# Patient Record
Sex: Female | Born: 1970
Health system: Southern US, Community
[De-identification: ages and names within clinical notes are randomized; demographics above are authoritative.]

## PROBLEM LIST (undated history)

## (undated) DIAGNOSIS — K8020 Calculus of gallbladder without cholecystitis without obstruction: Secondary | ICD-10-CM

## (undated) DIAGNOSIS — N649 Disorder of breast, unspecified: Secondary | ICD-10-CM

## (undated) DIAGNOSIS — R9389 Abnormal findings on diagnostic imaging of other specified body structures: Secondary | ICD-10-CM

## (undated) DIAGNOSIS — I10 Essential (primary) hypertension: Secondary | ICD-10-CM

## (undated) DIAGNOSIS — K219 Gastro-esophageal reflux disease without esophagitis: Secondary | ICD-10-CM

## (undated) DIAGNOSIS — F419 Anxiety disorder, unspecified: Secondary | ICD-10-CM

## (undated) DIAGNOSIS — E119 Type 2 diabetes mellitus without complications: Secondary | ICD-10-CM

## (undated) DIAGNOSIS — M7989 Other specified soft tissue disorders: Secondary | ICD-10-CM

## (undated) DIAGNOSIS — R7303 Prediabetes: Secondary | ICD-10-CM

## (undated) DIAGNOSIS — R87629 Unspecified abnormal cytological findings in specimens from vagina: Secondary | ICD-10-CM

## (undated) DIAGNOSIS — E785 Hyperlipidemia, unspecified: Secondary | ICD-10-CM

## (undated) DIAGNOSIS — G4730 Sleep apnea, unspecified: Secondary | ICD-10-CM

## (undated) HISTORY — DX: Prediabetes: R73.03

## (undated) HISTORY — PX: CHOLECYSTECTOMY: SHX55

## (undated) HISTORY — PX: NASAL SINUS SURGERY: SHX719

## (undated) HISTORY — DX: Type 2 diabetes mellitus without complications: E11.9

## (undated) HISTORY — DX: Calculus of gallbladder without cholecystitis without obstruction: K80.20

## (undated) HISTORY — DX: Essential (primary) hypertension: I10

## (undated) HISTORY — DX: Unspecified abnormal cytological findings in specimens from vagina: R87.629

## (undated) HISTORY — DX: Abnormal findings on diagnostic imaging of other specified body structures: R93.89

## (undated) HISTORY — DX: Gastro-esophageal reflux disease without esophagitis: K21.9

## (undated) HISTORY — DX: Other specified soft tissue disorders: M79.89

## (undated) HISTORY — DX: Anxiety disorder, unspecified: F41.9

## (undated) HISTORY — DX: Hyperlipidemia, unspecified: E78.5

## (undated) HISTORY — DX: Sleep apnea, unspecified: G47.30

## (undated) HISTORY — DX: Disorder of breast, unspecified: N64.9

---

## 2003-04-08 ENCOUNTER — Ambulatory Visit (HOSPITAL_COMMUNITY): Admission: RE | Admit: 2003-04-08 | Discharge: 2003-04-08 | Payer: Self-pay | Admitting: Advanced Practice Midwife

## 2003-04-08 ENCOUNTER — Encounter: Payer: Self-pay | Admitting: Obstetrics and Gynecology

## 2003-05-02 ENCOUNTER — Ambulatory Visit (HOSPITAL_COMMUNITY): Admission: RE | Admit: 2003-05-02 | Discharge: 2003-05-02 | Payer: Self-pay | Admitting: General Surgery

## 2004-04-12 ENCOUNTER — Ambulatory Visit (HOSPITAL_COMMUNITY): Admission: RE | Admit: 2004-04-12 | Discharge: 2004-04-12 | Payer: Self-pay | Admitting: Otolaryngology

## 2005-06-01 ENCOUNTER — Ambulatory Visit (HOSPITAL_COMMUNITY): Admission: RE | Admit: 2005-06-01 | Discharge: 2005-06-01 | Payer: Self-pay | Admitting: Otolaryngology

## 2005-08-12 ENCOUNTER — Encounter (INDEPENDENT_AMBULATORY_CARE_PROVIDER_SITE_OTHER): Payer: Self-pay | Admitting: *Deleted

## 2005-08-12 ENCOUNTER — Ambulatory Visit (HOSPITAL_BASED_OUTPATIENT_CLINIC_OR_DEPARTMENT_OTHER): Admission: RE | Admit: 2005-08-12 | Discharge: 2005-08-12 | Payer: Self-pay | Admitting: Otolaryngology

## 2005-08-12 ENCOUNTER — Ambulatory Visit (HOSPITAL_COMMUNITY): Admission: RE | Admit: 2005-08-12 | Discharge: 2005-08-12 | Payer: Self-pay | Admitting: Otolaryngology

## 2005-12-07 ENCOUNTER — Ambulatory Visit (HOSPITAL_COMMUNITY): Admission: RE | Admit: 2005-12-07 | Discharge: 2005-12-07 | Payer: Self-pay | Admitting: Obstetrics and Gynecology

## 2007-11-29 ENCOUNTER — Other Ambulatory Visit: Admission: RE | Admit: 2007-11-29 | Discharge: 2007-11-29 | Payer: Self-pay | Admitting: Obstetrics and Gynecology

## 2007-12-05 ENCOUNTER — Ambulatory Visit (HOSPITAL_COMMUNITY): Admission: RE | Admit: 2007-12-05 | Discharge: 2007-12-05 | Payer: Self-pay | Admitting: Obstetrics & Gynecology

## 2008-12-10 ENCOUNTER — Other Ambulatory Visit: Admission: RE | Admit: 2008-12-10 | Discharge: 2008-12-10 | Payer: Self-pay | Admitting: Obstetrics and Gynecology

## 2010-01-04 ENCOUNTER — Other Ambulatory Visit: Admission: RE | Admit: 2010-01-04 | Discharge: 2010-01-04 | Payer: Self-pay | Admitting: Obstetrics and Gynecology

## 2011-01-12 ENCOUNTER — Other Ambulatory Visit (HOSPITAL_COMMUNITY)
Admission: RE | Admit: 2011-01-12 | Discharge: 2011-01-12 | Disposition: A | Discharge status: Home / Self Care | Payer: PRIVATE HEALTH INSURANCE | Source: Ambulatory Visit | Attending: Obstetrics and Gynecology | Admitting: Obstetrics and Gynecology

## 2011-01-12 ENCOUNTER — Other Ambulatory Visit: Payer: Self-pay | Admitting: Family Medicine

## 2011-01-12 DIAGNOSIS — Z01419 Encounter for gynecological examination (general) (routine) without abnormal findings: Secondary | ICD-10-CM | POA: Insufficient documentation

## 2011-03-18 NOTE — Op Note (Signed)
NAME:  Miranda Williams, Miranda Williams                ACCOUNT NO.:  [id number]   MEDICAL RECORD NO.:  [id number]          PATIENT TYPE:  AMB   LOCATION:  DSC                          FACILITY:  [facility]   PHYSICIAN:  Lucky Cowboy, MD         DATE OF BIRTH:  09/11/1971   DATE OF PROCEDURE:  08/12/2005  DATE OF DISCHARGE:                                 OPERATIVE REPORT   PREOPERATIVE DIAGNOSIS:  Right maxillary sinus polyp.   POSTOPERATIVE DIAGNOSIS:  Right maxillary sinus polyp.   PROCEDURE:  Right maxillary antrotomy with removal of right maxillary sinus  polyp.   SURGEON:  Lucky Cowboy, MD   ANESTHESIA:  General.   ESTIMATED BLOOD LOSS:  20 mL.   COMPLICATIONS:  None.   INDICATIONS:  This patient is a 40 year old female who has had a  progressively enlarging right maxillary sinus polyp which was which was  originally identified on a Panorex.  Due to the progressive enlargement and  obstruction of the natural ostia, this is removed today.   FINDINGS:  The patient was noted to have a large polyp filling the right  maxillary sinus.  This was sent to Pathology.  The bone overlying the  maxillary ostia was extremely thickened.   PROCEDURE:  The patient was taken to the operating room and placed on the  table in the supine position.  She was then placed under general  endotracheal anesthesia and the table rotated counterclockwise 90 degrees.  The face was then prepped with Betadine.  Each of the nasal cavities were  decongested with Afrin on a cottonoid pledget.  1% lidocaine with 1:100,000  of epinephrine was used to inject the right uncinate process and middle  turbinate.  After allowing time for vasoconstrictive effect, the middle  turbinate was medialized.  The uncinate process was taken down in the  midportion and inferiorly.  The microdebrider was used to take down the  residual portion of uncinate process.  Just superior to the entrance of the  maxillary sinus, the curved suction was used to  open up this area.  A small  amount of orbital fat was encountered and this was left alone and attention  turned just inferior to this site.  At this point, the very thick bone was  encountered.  This area was then opened and debrided widely, and the large  polyp debrided out of the maxillary sinus cavity using a 45-degree Storz-  Hopkins endoscope as well as a 30-degree Storz-Hopkins endoscope along with  the straight and curved microdebriders.  Surgicel was then placed around the  area debridement along with Ciprodex-soaked Gelfoam.  The nasal cavities and  the oral cavity were suctioned free of blood.  The table was rotated  clockwise 90 degrees to its original position.  The patient was awakened  from anesthesia and taken to the postanesthesia care unit in stable  condition.  There were no complications.      Lucky Cowboy, MD  Electronically Signed     SJ/MEDQ  D:  08/12/2005  T:  08/13/2005  Job:  [id number]  cc:   [facility] Th[facility]   [place], [place] Elyse Hsu MD   Doreen Beam  Fax: [phone]

## 2011-03-18 NOTE — H&P (Signed)
   NAME:  Miranda Williams, Miranda Williams                          ACCOUNT NO.:  [id number]   MEDICAL RECORD NO.:  [id number]                   PATIENT TYPE:  OUT   LOCATION:  RAD                                  FACILITY:  [facility]   PHYSICIAN:  Dalia Heading, M.D.               DATE OF BIRTH:  [birth year]   DATE OF ADMISSION:  04/08/2003  DATE OF DISCHARGE:  04/08/2003                                HISTORY & PHYSICAL   CHIEF COMPLAINT:  Biliary colic secondary to cholelithiasis.   HISTORY OF PRESENT ILLNESS:  The patient is a 40 year old white female who  was referred for evaluation and treatment of biliary colic secondary to  cholelithiasis.  She has had right upper quadrant abdominal pain with  radiation to the right flank and back and some discomfort last weekend.  No  nausea, vomiting, postprandial symptoms, fever, chills, or jaundice have  been noted.  Her symptoms have been worsening.   PAST MEDICAL HISTORY:  Unremarkable.   PAST SURGICAL HISTORY:  Unremarkable.   CURRENT MEDICATIONS:  None.   ALLERGIES:  No known drug allergies.   REVIEW OF SYSTEMS:  Noncontributory.   PHYSICAL EXAMINATION:  GENERAL:  The patient is a well developed, well  nourished white female in no acute distress.  VITAL SIGNS:  The patient is afebrile, and vital signs are stable.  LUNGS:  Clear to auscultation with equal breath sounds bilaterally.  HEENT:  No scleral icterus.  HEART:  Regular rate and rhythm without S3, S4, or murmurs.  ABDOMEN:  Soft, nontender, nondistended.  No hepatosplenomegaly, masses, or  hernias are noted.   LABORATORY DATA:  Ultrasound of the gallbladder reveals cholelithiasis with  a mildly thickened gallbladder wall and normal common bile duct.   IMPRESSION:  1. Biliary colic.  2. Cholelithiasis.    PLAN:  The patient is scheduled for laparoscopic cholecystectomy on  05/02/2003.  The risks and benefits of the procedure including bleeding,  infection, hepatobiliary injury, and the  possibility of an open procedure  were fully explained to the patient who gave full and informed consent.                                               Dalia Heading, M.D.    MAJ/MEDQ  D:  04/14/2003  T:  04/14/2003  Job:  [id number]   cc:   Dr. Barnett Abu, [facility]   Lazaro Arms, M.D.  [address], [address]  [place]  [place] [address]  Fax: [phone]

## 2011-03-18 NOTE — Op Note (Signed)
NAME:  YOSSELIN, ZOELLER                          ACCOUNT NO.:  [id number]   MEDICAL RECORD NO.:  [id number]                   PATIENT TYPE:  AMB   LOCATION:  DAY                                  FACILITY:  [facility]   PHYSICIAN:  Dalia Heading, M.D.               DATE OF BIRTH:  May 24, 1971   DATE OF PROCEDURE:  05/02/2003  DATE OF DISCHARGE:                                 OPERATIVE REPORT   PREOPERATIVE DIAGNOSES:  1. Cholecystitis.  2. Cholelithiasis.  3. Multiple skin tags on neck.   POSTOPERATIVE DIAGNOSES:  1. Cholecystitis.  2. Cholelithiasis.  3. Multiple skin tags on neck.   PROCEDURES:  1. Laparoscopic cholecystectomy.  2. Fulguration of multiple skin tags, neck.   SURGEON:  Dalia Heading, M.D.   ANESTHESIA:  General endotracheal.   INDICATIONS:  The patient is a 40 year old white female who presents with  biliary colic secondary to cholelithiasis as well as multiple skin tags on  her neck, which she would like removed.  The risks and benefits of the  procedure, including bleeding, infection, hepatobiliary injury, and the  possibility of an open procedure, were fully explained to the patient, who  gave informed consent.   PROCEDURE NOTE:  The patient was placed in the supine position.  After  induction of general endotracheal anesthesia, the abdomen was prepped and  draped using the usual sterile technique with Betadine.  Surgical site  confirmation was performed.   A supraumbilical incision was made down to the fascia.  The Veress needle  was introduced into the abdominal cavity and confirmation of placement was  done using the saline drop test. The abdomen was then insufflated to 16 mmHg  pressure.  An 11 mm trocar was introduced into the abdominal cavity under  direct visualization without difficulty.  The patient was placed in reverse  Trendelenburg position and an additional 11 mm trocar was placed in the  epigastric region and 5 mm trocars were then placed  in the right upper  quadrant and right flank regions.  The liver was inspected and noted to be  within normal limits.  The gallbladder was retracted superiorly and  laterally.  The dissection was begun around the infundibulum of the  gallbladder.  The cystic duct was first identified.  Its junction to the  infundibulum fully identified.  Endoclips were placed proximally and  distally on the cystic duct and the cystic duct was divided.  This was  likewise done to the cystic artery.  The gallbladder was then freed away  from the gallbladder fossa using Bovie electrocautery.  The gallbladder was  delivered through the epigastric trocar site using an EndoCatch bag.  The  gallbladder fossa was inspected and no abnormal bleeding or bile leakage was  noted.  Surgicel was placed in the gallbladder fossa.  All fluid and air  were then evacuated from the abdominal cavity prior to  removal of the  trocars.   All wounds were irrigated with normal saline.  All wounds were injected with  0.5% Sensorcaine.  The supraumbilical fascia as well as epigastric fascia  were reapproximated using an 0 Vicryl interrupted suture.  All skin  incisions were closed using staples.  Betadine ointment and dry sterile  dressing were applied.   Next, multiple neck skin tags were fulgurated using Bovie electrocautery.  None of the lesions were more than 2 mm in space diameter.   All tape and needle counts were correct at the end of the procedure.  The  patient was awakened and transferred to PACU in stable condition.   COMPLICATIONS:  None.   SPECIMENS:  Gallbladder with stones.   ESTIMATED BLOOD LOSS:  Minimal.                                               Dalia Heading, M.D.    MAJ/MEDQ  D:  05/02/2003  T:  05/03/2003  Job:  [id number]   cc:   Doreen Beam  [address]  [place]  [place] [address]  Fax: [phone]   Lazaro Arms, M.D.  [address], [address]  [place]  [place] [address]  Fax: [phone]

## 2012-01-17 ENCOUNTER — Other Ambulatory Visit: Payer: Self-pay | Admitting: Family Medicine

## 2012-01-17 ENCOUNTER — Other Ambulatory Visit (HOSPITAL_COMMUNITY)
Admission: RE | Admit: 2012-01-17 | Discharge: 2012-01-17 | Disposition: A | Discharge status: Home / Self Care | Payer: PRIVATE HEALTH INSURANCE | Source: Ambulatory Visit | Attending: Obstetrics and Gynecology | Admitting: Obstetrics and Gynecology

## 2012-01-17 DIAGNOSIS — Z01419 Encounter for gynecological examination (general) (routine) without abnormal findings: Secondary | ICD-10-CM | POA: Insufficient documentation

## 2012-10-31 HISTORY — PX: BREAST BIOPSY: SHX20

## 2013-02-19 ENCOUNTER — Encounter: Payer: Self-pay | Admitting: *Deleted

## 2013-02-19 DIAGNOSIS — K8020 Calculus of gallbladder without cholecystitis without obstruction: Secondary | ICD-10-CM

## 2013-02-19 DIAGNOSIS — I10 Essential (primary) hypertension: Secondary | ICD-10-CM

## 2013-02-19 DIAGNOSIS — E7849 Other hyperlipidemia: Secondary | ICD-10-CM | POA: Insufficient documentation

## 2013-02-19 DIAGNOSIS — E785 Hyperlipidemia, unspecified: Secondary | ICD-10-CM | POA: Insufficient documentation

## 2013-02-19 DIAGNOSIS — E119 Type 2 diabetes mellitus without complications: Secondary | ICD-10-CM | POA: Insufficient documentation

## 2013-02-20 ENCOUNTER — Encounter: Payer: Self-pay | Admitting: Advanced Practice Midwife

## 2013-02-20 ENCOUNTER — Other Ambulatory Visit (HOSPITAL_COMMUNITY)
Admission: RE | Admit: 2013-02-20 | Discharge: 2013-02-20 | Disposition: A | Discharge status: Home / Self Care | Payer: PRIVATE HEALTH INSURANCE | Source: Ambulatory Visit | Attending: Obstetrics and Gynecology | Admitting: Obstetrics and Gynecology

## 2013-02-20 ENCOUNTER — Ambulatory Visit (INDEPENDENT_AMBULATORY_CARE_PROVIDER_SITE_OTHER): Payer: PRIVATE HEALTH INSURANCE | Admitting: Obstetrics & Gynecology

## 2013-02-20 VITALS — BP 126/74 | Ht 62.5 in | Wt 192.0 lb

## 2013-02-20 DIAGNOSIS — Z1151 Encounter for screening for human papillomavirus (HPV): Secondary | ICD-10-CM | POA: Insufficient documentation

## 2013-02-20 DIAGNOSIS — Z01419 Encounter for gynecological examination (general) (routine) without abnormal findings: Secondary | ICD-10-CM

## 2013-02-20 DIAGNOSIS — Z0000 Encounter for general adult medical examination without abnormal findings: Secondary | ICD-10-CM

## 2013-02-20 DIAGNOSIS — Z1212 Encounter for screening for malignant neoplasm of rectum: Secondary | ICD-10-CM

## 2013-02-20 LAB — HEMOCCULT GUIAC POC 1CARD (OFFICE)

## 2013-02-20 NOTE — Progress Notes (Signed)
History of Present Illness:  Miranda Williams 42 y.o. Filed Vitals:   02/20/13 1500  BP: 126/74  Here for annual exam.  She is a few weeks shy of getting a divorce.  Pt is in a stable relationship with a man who has had a vascetomy.  Labs 09/12/13 showed normal cholesterol, A1C.  Pt has lost ~ 20 lbs and exercises.     Current Medications, Allergies, Past Medical History, Past Surgical History, Family History and Social History were reviewed in [employer] record.     Review of Systems:  Review of Systems  Constitutional: Negative for fever, chills, weight loss, malaise/fatigue and diaphoresis.  HENT: Negative for hearing loss, ear pain, nosebleeds, congestion, sore throat, neck pain, tinnitus and ear discharge.   Eyes: Negative for blurred vision, double vision, photophobia, pain, discharge and redness.  Respiratory: Negative for cough, hemoptysis, sputum production, shortness of breath, wheezing and stridor.   Cardiovascular: Negative for chest pain, palpitations, orthopnea, claudication, leg swelling and PND.  Gastrointestinal:  Negative for abdominal pain,heartburn, nausea, vomiting, diarrhea, constipation, blood in stool and melena.  Genitourinary: Negative for dysuria, urgency, frequency, hematuria and flank pain.  Musculoskeletal: Negative for myalgias, back pain, joint pain and falls.  Skin: Negative for itching and rash.  Neurological: Negative for dizziness, tingling, tremors, sensory change, speech change, focal weakness, seizures, loss of consciousness, weakness and headaches.  Endo/Heme/Allergies: Negative for environmental allergies and polydipsia. Does not bruise/bleed easily.  Psychiatric/Behavioral: Negative for depression, suicidal ideas, hallucinations, memory loss and substance abuse. The patient is not nervous/anxious and does not have insomnia.     Physical Exam: General:  Well developed, well nourished, no acute distress Skin:  Warm and  dry Neck:  Midline trachea, normal thyroid Lungs; Clear to auscultation bilaterally Breast:  No dominant palpable mass, retraction, or nipple discharge Cardiovascular: Regular rate and rhythm Abdomen:  Soft, non tender, no hepatosplenomegaly Pelvic:  External genitalia is normal in appearance.  The vagina is normal in appearance.The cervix is bulbous.  Uterus is felt to be normal size, shape, and contour.  No adnexal masses or tenderness noted. Rectal: Good sphincter tone, no polyps, or hemorrhoids felt.  Hemoccult negative. Extremities:  No swelling or varicosities noted Psych:  No mood changes   Impression:  Normal GYN exam   Plan:  F/U 1 year.  Mammogram due

## 2013-02-21 ENCOUNTER — Other Ambulatory Visit: Payer: Self-pay | Admitting: Obstetrics & Gynecology

## 2013-02-21 MED ORDER — FLUCONAZOLE 150 MG PO TABS: 150.0000 mg | ORAL_TABLET | Freq: Once | ORAL | Status: DC

## 2013-02-22 NOTE — Telephone Encounter (Signed)
Diflucan eprescribed.

## 2013-03-12 ENCOUNTER — Other Ambulatory Visit: Payer: Self-pay | Admitting: Adult Health

## 2013-03-12 DIAGNOSIS — R921 Mammographic calcification found on diagnostic imaging of breast: Secondary | ICD-10-CM

## 2013-03-13 ENCOUNTER — Other Ambulatory Visit: Payer: Self-pay | Admitting: Advanced Practice Midwife

## 2013-03-18 ENCOUNTER — Ambulatory Visit
Admission: RE | Admit: 2013-03-18 | Discharge: 2013-03-18 | Disposition: A | Discharge status: Home / Self Care | Payer: PRIVATE HEALTH INSURANCE | Source: Ambulatory Visit | Attending: Adult Health | Admitting: Adult Health

## 2013-03-18 DIAGNOSIS — R921 Mammographic calcification found on diagnostic imaging of breast: Secondary | ICD-10-CM

## 2013-03-20 ENCOUNTER — Telehealth: Payer: Self-pay | Admitting: Advanced Practice Midwife

## 2013-03-20 NOTE — Telephone Encounter (Signed)
Shrita has already talked with radiologist about breast bx and has an appt for excision this Thursday in [place].

## 2013-12-16 ENCOUNTER — Ambulatory Visit: Payer: PRIVATE HEALTH INSURANCE | Admitting: Obstetrics and Gynecology

## 2013-12-20 ENCOUNTER — Telehealth: Payer: Self-pay

## 2013-12-20 NOTE — Telephone Encounter (Signed)
Left message x 1. JSY 

## 2013-12-23 NOTE — Telephone Encounter (Signed)
Left message x 2. JSY 

## 2013-12-27 NOTE — Telephone Encounter (Signed)
Multiple messages left for pt to return the call. Left another message today. Encounter closed. Miranda Williams

## 2013-12-31 ENCOUNTER — Encounter (INDEPENDENT_AMBULATORY_CARE_PROVIDER_SITE_OTHER): Payer: Self-pay | Admitting: *Deleted

## 2014-01-01 ENCOUNTER — Encounter (INDEPENDENT_AMBULATORY_CARE_PROVIDER_SITE_OTHER): Payer: Self-pay

## 2014-01-22 ENCOUNTER — Other Ambulatory Visit: Payer: Self-pay | Admitting: Advanced Practice Midwife

## 2014-01-22 MED ORDER — SULFAMETHOXAZOLE-TMP DS 800-160 MG PO TABS: 1.0000 | ORAL_TABLET | Freq: Two times a day (BID) | ORAL | Status: DC

## 2014-01-22 NOTE — Progress Notes (Signed)
Boil on buttocks (sent picture).  Not ready to be lanced.  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  Will treat as MRSA.  Septra DS BID X 14.  If it doesn't quiet down or drain on its own soon, come in for lancing

## 2014-03-05 ENCOUNTER — Encounter: Payer: Self-pay | Admitting: Advanced Practice Midwife

## 2014-03-05 ENCOUNTER — Other Ambulatory Visit (HOSPITAL_COMMUNITY)
Admission: RE | Admit: 2014-03-05 | Discharge: 2014-03-05 | Disposition: A | Discharge status: Home / Self Care | Payer: PRIVATE HEALTH INSURANCE | Source: Ambulatory Visit | Attending: Advanced Practice Midwife | Admitting: Advanced Practice Midwife

## 2014-03-05 ENCOUNTER — Ambulatory Visit (INDEPENDENT_AMBULATORY_CARE_PROVIDER_SITE_OTHER): Payer: PRIVATE HEALTH INSURANCE | Admitting: Advanced Practice Midwife

## 2014-03-05 VITALS — BP 128/80 | Ht 62.0 in | Wt 194.0 lb

## 2014-03-05 DIAGNOSIS — Z1151 Encounter for screening for human papillomavirus (HPV): Secondary | ICD-10-CM | POA: Insufficient documentation

## 2014-03-05 DIAGNOSIS — Z01419 Encounter for gynecological examination (general) (routine) without abnormal findings: Secondary | ICD-10-CM

## 2014-03-05 DIAGNOSIS — R9389 Abnormal findings on diagnostic imaging of other specified body structures: Secondary | ICD-10-CM

## 2014-03-05 HISTORY — DX: Abnormal findings on diagnostic imaging of other specified body structures: R93.89
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
## 2014-05-13 ENCOUNTER — Encounter: Payer: Self-pay | Admitting: Adult Health

## 2014-09-01 ENCOUNTER — Encounter: Payer: Self-pay | Admitting: Advanced Practice Midwife

## 2014-12-31 ENCOUNTER — Ambulatory Visit (INDEPENDENT_AMBULATORY_CARE_PROVIDER_SITE_OTHER): Payer: PRIVATE HEALTH INSURANCE | Admitting: Advanced Practice Midwife

## 2014-12-31 ENCOUNTER — Other Ambulatory Visit: Payer: PRIVATE HEALTH INSURANCE

## 2014-12-31 ENCOUNTER — Other Ambulatory Visit: Payer: Self-pay | Admitting: Advanced Practice Midwife

## 2014-12-31 ENCOUNTER — Encounter: Payer: Self-pay | Admitting: Advanced Practice Midwife

## 2014-12-31 VITALS — BP 146/78 | Ht 62.0 in | Wt 178.0 lb

## 2014-12-31 DIAGNOSIS — Z30430 Encounter for insertion of intrauterine contraceptive device: Secondary | ICD-10-CM | POA: Diagnosis not present

## 2014-12-31 DIAGNOSIS — Z30014 Encounter for initial prescription of intrauterine contraceptive device: Secondary | ICD-10-CM

## 2014-12-31 LAB — BETA HCG QUANT (REF LAB): HCG QUANT: 1 m[IU]/mL

## 2014-12-31 MED ORDER — CLONAZEPAM 0.5 MG PO TABS: 0.5000 mg | ORAL_TABLET | ORAL | Status: DC | PRN

## 2014-12-31 NOTE — Progress Notes (Signed)
Miranda Williams is a 44 y.o. year old Caucasian female Gravida 1 Para 1  who presents for placement of a Lyletta IUD. She has not had sex in over an [place] nd her pregnancy test today is negative.    The risks and benefits of the method and placement have been thouroughly reviewed with the patient and all questions were answered.  Specifically the patient is aware of failure rate of 10/998, expulsion of the IUD and of possible perforation.  The patient is aware of irregular bleeding due to the method and understands the incidence of irregular bleeding diminishes with time. She is also aware that as of today, the Diamantina Monks is approved for 3 years, with the expectation (although not guarantee) that it will receive 5 year, and most likely 7 year, approval).  Time out was performed.  A Graves speculum was placed.  The cervix was prepped using Betadine. The uterus was found to be tilted towards the left and it sounded to 8cm.  The cervix was grasped with a tenaculum and the IUD was inserted to 8 cm.  It was pulled back 1 cm and the IUD was disengaged.  The strings were trimmed to 3 cm.  Sonogram was performed and the proper placement of the IUD was verified.   She asked for a rx for Klonopin for PRN use for mild anxiety.  Rx Klonopin 0.5mg  #30 with no RF  The patient was instructed on signs and symptoms of infection and to check for the strings after each menses or each month.  The patient is to refrain from intercourse for 3 days.  The patient is scheduled for a return appointment after her first menses or 4 weeks.  CRESENZO-DISHMAN,Permelia Bamba 12/31/2014 3:56 PM

## 2015-03-11 ENCOUNTER — Other Ambulatory Visit: Payer: PRIVATE HEALTH INSURANCE | Admitting: Advanced Practice Midwife

## 2015-03-19 ENCOUNTER — Ambulatory Visit (INDEPENDENT_AMBULATORY_CARE_PROVIDER_SITE_OTHER): Payer: PRIVATE HEALTH INSURANCE | Admitting: Advanced Practice Midwife

## 2015-03-19 ENCOUNTER — Other Ambulatory Visit (HOSPITAL_COMMUNITY)
Admission: RE | Admit: 2015-03-19 | Discharge: 2015-03-19 | Disposition: A | Discharge status: Home / Self Care | Payer: PRIVATE HEALTH INSURANCE | Source: Ambulatory Visit | Attending: Advanced Practice Midwife | Admitting: Advanced Practice Midwife

## 2015-03-19 ENCOUNTER — Encounter: Payer: Self-pay | Admitting: Advanced Practice Midwife

## 2015-03-19 VITALS — BP 130/70 | HR 76 | Ht 62.0 in | Wt 186.0 lb

## 2015-03-19 DIAGNOSIS — Z1151 Encounter for screening for human papillomavirus (HPV): Secondary | ICD-10-CM | POA: Insufficient documentation

## 2015-03-19 DIAGNOSIS — Z01419 Encounter for gynecological examination (general) (routine) without abnormal findings: Secondary | ICD-10-CM | POA: Insufficient documentation

## 2015-03-19 DIAGNOSIS — Z113 Encounter for screening for infections with a predominantly sexual mode of transmission: Secondary | ICD-10-CM | POA: Diagnosis present

## 2015-03-19 DIAGNOSIS — Z1389 Encounter for screening for other disorder: Secondary | ICD-10-CM | POA: Diagnosis not present

## 2015-03-19 DIAGNOSIS — Z131 Encounter for screening for diabetes mellitus: Secondary | ICD-10-CM

## 2015-03-19 DIAGNOSIS — B9689 Other specified bacterial agents as the cause of diseases classified elsewhere: Secondary | ICD-10-CM

## 2015-03-19 DIAGNOSIS — N760 Acute vaginitis: Secondary | ICD-10-CM

## 2015-03-19 DIAGNOSIS — R8290 Unspecified abnormal findings in urine: Secondary | ICD-10-CM

## 2015-03-19 LAB — POCT URINALYSIS DIPSTICK
GLUCOSE UA: NEGATIVE
KETONES UA: NEGATIVE
LEUKOCYTES UA: NEGATIVE
NITRITE UA: NEGATIVE
Protein, UA: NEGATIVE
RBC UA: 3

## 2015-03-19 MED ORDER — MEGESTROL ACETATE 40 MG PO TABS: 40.0000 mg | ORAL_TABLET | Freq: Every day | ORAL | Status: DC

## 2015-03-19 MED ORDER — CLONAZEPAM 0.5 MG PO TABS: 0.5000 mg | ORAL_TABLET | Freq: Two times a day (BID) | ORAL | Status: DC | PRN

## 2015-03-19 MED ORDER — METRONIDAZOLE 0.75 % VA GEL: 1.0000 | Freq: Every day | VAGINAL | Status: DC

## 2015-03-19 NOTE — Progress Notes (Signed)
Miranda Williams 44 y.o.  Filed Vitals:   03/19/15 1455  BP: 130/70  Pulse: 76     Past Medical History: Past Medical History  Diagnosis Date  . Gallstones   . Hyperlipidemia   . Hypertension   . Diabetes mellitus without complication   . 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 Cholecystectomy    . Nasal sinus surgery      removal of benign tumor    Family History: Family History  Problem Relation Age of Onset  . Cancer Mother     liver  . Cancer Father     colon cancer    Social History: History  Substance Use Topics  . Smoking status: Never Smoker   . Smokeless tobacco: Never Used  . Alcohol Use: 0.0 oz/week    0 Standard drinks or equivalent per week     Comment: socially    Allergies: No Known Allergies    Current outpatient prescriptions:  .  clonazePAM (KLONOPIN) 0.5 MG tablet, Take 1 tablet (0.5 mg total) by mouth as needed for anxiety., Disp: 30 tablet, Rfl: 0  History of Present Illness: Here for pap and physical.  Paps have been normal.  However, pt has had a new sex partner and wants to get pap today (Still has same boyfriend too). Husband still stalling on divorce.  Had IUD placed 12/2014 and has had off and on bleeding ever since, as can be expected.  Notices a fishy odor.  Declines HSV 2 testing. Had normal colonoscopy last year. Has not had to take diabetes or cholesterol meds in >2 years.  Review of Systems   Patient denies any headaches, blurred vision, shortness of breath, chest pain, abdominal pain, problems with bowel movements,  or intercourse.  Says urine smells "strong"   Physical Exam: General:  Well developed, well nourished, no acute distress Skin:  Warm and dry Neck:  Midline trachea, normal thyroid Lungs; Clear to auscultation bilaterally Breast:  No dominant palpable mass, retraction, or nipple  discharge Cardiovascular: Regular rate and rhythm Abdomen:  Soft, non tender, no hepatosplenomegaly Pelvic:  External genitalia is normal in appearance.  The vagina is normal in appearance.  The cervix is bulbous. Small amount bleeding. Slight amine odor, but no abnormal discharge.  Uterus is felt to be normal size, shape, and contour.  No adnexal masses or tenderness noted.  Extremities:  No swelling or varicosities noted Psych:  No mood changes.     Impression: BV, otherwise normal gyn exam     Plan: Metrogel qhs X5 Megace 40-80 mg qd prn bleeding Klonopin 0.5mg  prn anxiety (rarely uses) TSH, HGBA1C, CBC, CMP, lipids, HIV, RPR Mammogram in July

## 2015-03-23 LAB — CYTOLOGY - PAP

## 2015-04-15 ENCOUNTER — Other Ambulatory Visit: Payer: PRIVATE HEALTH INSURANCE | Admitting: Advanced Practice Midwife

## 2016-04-28 ENCOUNTER — Encounter: Payer: Self-pay | Admitting: Advanced Practice Midwife

## 2016-04-28 ENCOUNTER — Ambulatory Visit (INDEPENDENT_AMBULATORY_CARE_PROVIDER_SITE_OTHER): Payer: PRIVATE HEALTH INSURANCE | Admitting: Advanced Practice Midwife

## 2016-04-28 ENCOUNTER — Other Ambulatory Visit (HOSPITAL_COMMUNITY)
Admission: RE | Admit: 2016-04-28 | Discharge: 2016-04-28 | Disposition: A | Discharge status: Home / Self Care | Payer: PRIVATE HEALTH INSURANCE | Source: Ambulatory Visit | Attending: Advanced Practice Midwife | Admitting: Advanced Practice Midwife

## 2016-04-28 VITALS — BP 120/80 | HR 76 | Ht 62.0 in | Wt 187.0 lb

## 2016-04-28 DIAGNOSIS — Z01411 Encounter for gynecological examination (general) (routine) with abnormal findings: Secondary | ICD-10-CM | POA: Diagnosis present

## 2016-04-28 DIAGNOSIS — Z1151 Encounter for screening for human papillomavirus (HPV): Secondary | ICD-10-CM | POA: Insufficient documentation

## 2016-04-28 DIAGNOSIS — Z01419 Encounter for gynecological examination (general) (routine) without abnormal findings: Secondary | ICD-10-CM | POA: Diagnosis not present

## 2016-04-28 MED ORDER — CLONAZEPAM 0.5 MG PO TABS: 0.5000 mg | ORAL_TABLET | Freq: Two times a day (BID) | ORAL | Status: DC | PRN

## 2016-04-28 NOTE — Progress Notes (Signed)
Miranda Williams 45 y.o.  Filed Vitals:   04/28/16 0844  BP: 120/80  Pulse: 76     Filed Weights   04/28/16 0844  Weight: 187 lb (84.823 kg)    Past Medical History: Past Medical History  Diagnosis Date  . Gallstones   . Hyperlipidemia   . Hypertension   . Diabetes mellitus without complication ([place])   . 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 Cholecystectomy    . Nasal sinus surgery      removal of benign tumor    Family History: Family History  Problem Relation Age of Onset  . Cancer Mother     liver  . Cancer Father     colon cancer    Social History: Social History  Substance Use Topics  . Smoking status: Never Smoker   . Smokeless tobacco: Never Used  . Alcohol Use: 0.0 oz/week    0 Standard drinks or equivalent per week     Comment: socially    Allergies: No Known Allergies    Current outpatient prescriptions:  .  clonazePAM (KLONOPIN) 0.5 MG tablet, Take 1 tablet (0.5 mg total) by mouth 2 (two) times daily as needed for anxiety., Disp: 30 tablet, Rfl: 0  History of Present Illness: Here for pap and physical.  Gets them yearly due to work incentives.   Looking for another job d/t being worried about [employer] closing.  Has one partner now, not that into him anymore.  Is divorced but doesn't have a settlement yet.  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 Rarely bleeds w/IUD.   Review of Systems   Patient denies any headaches, blurred vision, shortness of breath, chest pain, abdominal pain, problems with bowel movements, urination, or intercourse.   Physical Exam: General:  Well developed, well nourished, no acute distress Skin:  Warm and dry Neck:  Midline trachea, normal thyroid Lungs; Clear to auscultation bilaterally Breast:  No dominant palpable mass, retraction, or nipple discharge Cardiovascular:  Regular rate and rhythm Abdomen:  Soft, non tender, no hepatosplenomegaly Pelvic:  External genitalia is normal in appearance.  The vagina is normal in appearance.  The cervix is bulbous.  Uterus is felt to be normal size, shape, and contour.  No adnexal masses or tenderness noted. Exam limited by habitus. IUD strings not visible Extremities:  No swelling or varicosities noted Psych:  No mood changes.     Impression: normal GYN exam     Plan: yearly physicals while working at [employer] in July Order for screening labs given

## 2016-05-02 LAB — CYTOLOGY - PAP

## 2016-05-20 ENCOUNTER — Ambulatory Visit (INDEPENDENT_AMBULATORY_CARE_PROVIDER_SITE_OTHER): Payer: PRIVATE HEALTH INSURANCE | Admitting: Obstetrics & Gynecology

## 2016-05-20 ENCOUNTER — Encounter: Payer: Self-pay | Admitting: Obstetrics & Gynecology

## 2016-05-20 VITALS — BP 152/82 | HR 68 | Ht 62.0 in | Wt 191.0 lb

## 2016-05-20 DIAGNOSIS — R87610 Atypical squamous cells of undetermined significance on cytologic smear of cervix (ASC-US): Secondary | ICD-10-CM | POA: Diagnosis not present

## 2016-05-20 DIAGNOSIS — Z3202 Encounter for pregnancy test, result negative: Secondary | ICD-10-CM | POA: Diagnosis not present

## 2016-05-20 DIAGNOSIS — R896 Abnormal cytological findings in specimens from other organs, systems and tissues: Secondary | ICD-10-CM | POA: Diagnosis not present

## 2016-05-20 DIAGNOSIS — IMO0002 Reserved for concepts with insufficient information to code with codable children: Secondary | ICD-10-CM

## 2016-05-20 LAB — POCT URINE PREGNANCY: PREG TEST UR: NEGATIVE

## 2016-05-20 NOTE — Progress Notes (Signed)
Patient ID: Miranda Williams, female   DOB: 03/11/1971, 45 y.o.   MRN: [id number] Colposcopy Procedure Note:  Colposcopy Procedure Note  Indications: Pap smear 1 months ago showed: ASCUS with NEGATIVE high risk HPV. The prior pap showed ASCUS with POSITIVE high risk HPV.  Prior cervical/vaginal disease: . Prior cervical treatment: .  Smoker:  No. New sexual partner:  Yes.    : time frame:  3 years  History of abnormal Pap: no  Procedure Details  The risks and benefits of the procedure and Written informed consent obtained.  Speculum placed in vagina and excellent visualization of cervix achieved, cervix swabbed x 3 with acetic acid solution.  Findings: Cervix: no visible lesions, no mosaicism, no punctation and no abnormal vasculature; no biopsies taken. Vaginal inspection: normal without visible lesions. Vulvar colposcopy: vulvar colposcopy not performed.  Specimens: none  Complications: none.  Plan: Pap 1 year

## 2016-06-01 ENCOUNTER — Encounter: Payer: Self-pay | Admitting: Advanced Practice Midwife

## 2016-10-26 ENCOUNTER — Other Ambulatory Visit: Payer: Self-pay | Admitting: Advanced Practice Midwife

## 2016-10-26 ENCOUNTER — Telehealth: Payer: Self-pay | Admitting: Advanced Practice Midwife

## 2016-10-26 DIAGNOSIS — N6311 Unspecified lump in the right breast, upper outer quadrant: Secondary | ICD-10-CM

## 2016-10-26 NOTE — Telephone Encounter (Signed)
Order in.

## 2016-10-26 NOTE — Telephone Encounter (Signed)
Informed pt that order is in for the U/S, pt can call and schedule.  Pt verbalized understanding.

## 2016-11-02 ENCOUNTER — Telehealth: Payer: PRIVATE HEALTH INSURANCE | Admitting: Family

## 2016-11-02 DIAGNOSIS — R05 Cough: Secondary | ICD-10-CM

## 2016-11-02 DIAGNOSIS — R059 Cough, unspecified: Secondary | ICD-10-CM

## 2016-11-02 DIAGNOSIS — J069 Acute upper respiratory infection, unspecified: Secondary | ICD-10-CM

## 2016-11-02 MED ORDER — AZITHROMYCIN 250 MG PO TABS: ORAL_TABLET | ORAL | 0 refills | Status: DC

## 2016-11-02 MED ORDER — BENZONATATE 100 MG PO CAPS: 100.0000 mg | ORAL_CAPSULE | Freq: Two times a day (BID) | ORAL | 0 refills | Status: DC | PRN

## 2016-11-02 NOTE — Progress Notes (Signed)
We are sorry that you are not feeling well.  Here is how we plan to help!  Based on what you have shared with me it looks like you have upper respiratory tract inflammation that has resulted in a significant cough.  Inflammation and infection in the upper respiratory tract is commonly called bronchitis and has four common causes:  Allergies, Viral Infections, Acid Reflux and Bacterial Infections.  Allergies, viruses and acid reflux are treated by controlling symptoms or eliminating the cause. An example might be a cough caused by taking certain blood pressure medications. You stop the cough by changing the medication. Another example might be a cough caused by acid reflux. Controlling the reflux helps control the cough.  Based on your presentation I believe you most likely have A cough due to bacteria.  When patients have a fever and a productive cough with a change in color or increased sputum production, we are concerned about bacterial bronchitis.  If left untreated it can progress to pneumonia.  If your symptoms do not improve with your treatment plan it is important that you contact your provider.   I have prescribed Azithromyin 250 mg: two tables now and then one tablet daily for 4 additonal days      In addition you may use A non-prescription cough medication called Robitussin DAC. Take 2 teaspoons every 8 hours or Delsym: take 2 teaspoons every 12 hours. and A non-prescription cough medication called Mucinex DM: take 2 tablets every 12 hours.    USE OF BRONCHODILATOR ("RESCUE") INHALERS: There is a risk from using your bronchodilator too frequently.  The risk is that over-reliance on a medication which only relaxes the muscles surrounding the breathing tubes can reduce the effectiveness of medications prescribed to reduce swelling and congestion of the tubes themselves.  Although you feel brief relief from the bronchodilator inhaler, your asthma may actually be worsening with the tubes becoming  more swollen and filled with mucus.  This can delay other crucial treatments, such as oral steroid medications. If you need to use a bronchodilator inhaler daily, several times per day, you should discuss this with your provider.  There are probably better treatments that could be used to keep your asthma under control.     HOME CARE . Only take medications as instructed by your medical team. . Complete the entire course of an antibiotic. . Drink plenty of fluids and get plenty of rest. . Avoid close contacts especially the very young and the elderly . Cover your mouth if you cough or cough into your sleeve. . Always remember to wash your hands . A steam or ultrasonic humidifier can help congestion.   GET HELP RIGHT AWAY IF: . You develop worsening fever. . You become short of breath . You cough up blood. . Your symptoms persist after you have completed your treatment plan MAKE SURE YOU   Understand these instructions.  Will watch your condition.  Will get help right away if you are not doing well or get worse.  Your e-visit answers were reviewed by a board certified advanced clinical practitioner to complete your personal care plan.  Depending on the condition, your plan could have included both over the counter or prescription medications. If there is a problem please reply  once you have received a response from your provider. Your safety is important to [place].  If you have drug allergies check your prescription carefully.    You can use MyChart to ask questions about today's visit,  request a non-urgent call back, or ask for a work or school excuse for 24 hours related to this e-Visit. If it has been greater than 24 hours you will need to follow up with your provider, or enter a new e-Visit to address those concerns. You will get an e-mail in the next two days asking about your experience.  I hope that your e-visit has been valuable and will speed your recovery. Thank you for using  e-visits.

## 2016-11-15 ENCOUNTER — Encounter (HOSPITAL_COMMUNITY): Payer: PRIVATE HEALTH INSURANCE

## 2016-11-17 ENCOUNTER — Other Ambulatory Visit (HOSPITAL_COMMUNITY): Payer: Self-pay | Admitting: Family Medicine

## 2016-11-23 ENCOUNTER — Other Ambulatory Visit: Payer: Self-pay | Admitting: Advanced Practice Midwife

## 2016-11-23 DIAGNOSIS — N6489 Other specified disorders of breast: Secondary | ICD-10-CM

## 2016-11-29 ENCOUNTER — Encounter (HOSPITAL_COMMUNITY): Payer: PRIVATE HEALTH INSURANCE

## 2016-11-29 ENCOUNTER — Ambulatory Visit (HOSPITAL_COMMUNITY)
Admission: RE | Admit: 2016-11-29 | Discharge: 2016-11-29 | Disposition: A | Discharge status: Home / Self Care | Payer: 59 | Source: Ambulatory Visit | Attending: Advanced Practice Midwife | Admitting: Advanced Practice Midwife

## 2016-11-29 DIAGNOSIS — N6489 Other specified disorders of breast: Secondary | ICD-10-CM | POA: Insufficient documentation

## 2016-11-29 DIAGNOSIS — R928 Other abnormal and inconclusive findings on diagnostic imaging of breast: Secondary | ICD-10-CM | POA: Diagnosis not present

## 2016-11-30 DIAGNOSIS — Z6835 Body mass index (BMI) 35.0-35.9, adult: Secondary | ICD-10-CM | POA: Diagnosis not present

## 2016-11-30 DIAGNOSIS — J309 Allergic rhinitis, unspecified: Secondary | ICD-10-CM | POA: Diagnosis not present

## 2016-12-15 ENCOUNTER — Telehealth: Payer: PRIVATE HEALTH INSURANCE | Admitting: Physician Assistant

## 2016-12-15 DIAGNOSIS — L255 Unspecified contact dermatitis due to plants, except food: Secondary | ICD-10-CM

## 2016-12-15 DIAGNOSIS — L237 Allergic contact dermatitis due to plants, except food: Secondary | ICD-10-CM

## 2016-12-15 MED ORDER — PREDNISONE 10 MG (21) PO TBPK: ORAL_TABLET | ORAL | 0 refills | Status: DC

## 2016-12-15 NOTE — Progress Notes (Signed)

## 2016-12-26 DIAGNOSIS — Z0000 Encounter for general adult medical examination without abnormal findings: Secondary | ICD-10-CM | POA: Diagnosis not present

## 2016-12-29 DIAGNOSIS — E782 Mixed hyperlipidemia: Secondary | ICD-10-CM | POA: Diagnosis not present

## 2016-12-29 DIAGNOSIS — Z6836 Body mass index (BMI) 36.0-36.9, adult: Secondary | ICD-10-CM | POA: Diagnosis not present

## 2016-12-29 DIAGNOSIS — Z0000 Encounter for general adult medical examination without abnormal findings: Secondary | ICD-10-CM | POA: Diagnosis not present

## 2016-12-29 DIAGNOSIS — L247 Irritant contact dermatitis due to plants, except food: Secondary | ICD-10-CM | POA: Diagnosis not present

## 2017-03-21 DIAGNOSIS — H5203 Hypermetropia, bilateral: Secondary | ICD-10-CM | POA: Diagnosis not present

## 2017-03-21 DIAGNOSIS — H52223 Regular astigmatism, bilateral: Secondary | ICD-10-CM | POA: Diagnosis not present

## 2017-03-30 DIAGNOSIS — E782 Mixed hyperlipidemia: Secondary | ICD-10-CM | POA: Diagnosis not present

## 2017-03-30 DIAGNOSIS — Z6835 Body mass index (BMI) 35.0-35.9, adult: Secondary | ICD-10-CM | POA: Diagnosis not present

## 2017-03-30 DIAGNOSIS — E6609 Other obesity due to excess calories: Secondary | ICD-10-CM | POA: Diagnosis not present

## 2017-03-30 DIAGNOSIS — Z23 Encounter for immunization: Secondary | ICD-10-CM | POA: Diagnosis not present

## 2017-04-01 DIAGNOSIS — E6609 Other obesity due to excess calories: Secondary | ICD-10-CM | POA: Diagnosis not present

## 2017-04-01 DIAGNOSIS — E782 Mixed hyperlipidemia: Secondary | ICD-10-CM | POA: Diagnosis not present

## 2017-06-02 ENCOUNTER — Telehealth: Payer: Self-pay | Admitting: Gastroenterology

## 2017-06-02 MED ORDER — DIETHYLPROPION HCL 25 MG PO TABS: ORAL_TABLET | ORAL | 0 refills | Status: DC

## 2017-06-02 NOTE — Telephone Encounter (Addendum)
PT DESIRES WEIGHT LOSS. WEIGHT: 186 LBS 5 FT 3 IN. BP 117/76 TODAY. PT SEEN AND EXAMINED. RX FOR TENUATE GIVEN.

## 2017-12-01 ENCOUNTER — Telehealth: Payer: Self-pay | Admitting: Gastroenterology

## 2017-12-01 MED ORDER — DIETHYLPROPION HCL 25 MG PO TABS: ORAL_TABLET | ORAL | 0 refills | Status: DC

## 2017-12-01 NOTE — Telephone Encounter (Signed)
PT WEIGHT DOWN TO [place].

## 2018-01-31 ENCOUNTER — Encounter: Payer: Self-pay | Admitting: Advanced Practice Midwife

## 2018-01-31 ENCOUNTER — Ambulatory Visit (INDEPENDENT_AMBULATORY_CARE_PROVIDER_SITE_OTHER): Payer: 59 | Admitting: Advanced Practice Midwife

## 2018-01-31 ENCOUNTER — Other Ambulatory Visit (HOSPITAL_COMMUNITY)
Admission: RE | Admit: 2018-01-31 | Discharge: 2018-01-31 | Disposition: A | Discharge status: Home / Self Care | Payer: 59 | Source: Ambulatory Visit | Attending: Advanced Practice Midwife | Admitting: Advanced Practice Midwife

## 2018-01-31 VITALS — BP 140/90 | HR 92 | Ht 62.2 in | Wt 189.3 lb

## 2018-01-31 DIAGNOSIS — R3989 Other symptoms and signs involving the genitourinary system: Secondary | ICD-10-CM

## 2018-01-31 DIAGNOSIS — Z01411 Encounter for gynecological examination (general) (routine) with abnormal findings: Secondary | ICD-10-CM | POA: Diagnosis not present

## 2018-01-31 DIAGNOSIS — N921 Excessive and frequent menstruation with irregular cycle: Secondary | ICD-10-CM

## 2018-01-31 DIAGNOSIS — Z1329 Encounter for screening for other suspected endocrine disorder: Secondary | ICD-10-CM

## 2018-01-31 DIAGNOSIS — Z01419 Encounter for gynecological examination (general) (routine) without abnormal findings: Secondary | ICD-10-CM | POA: Diagnosis not present

## 2018-01-31 DIAGNOSIS — Z13220 Encounter for screening for lipoid disorders: Secondary | ICD-10-CM | POA: Diagnosis not present

## 2018-01-31 LAB — POCT URINALYSIS DIPSTICK
Blood, UA: NEGATIVE
GLUCOSE UA: NEGATIVE
Ketones, UA: NEGATIVE
LEUKOCYTES UA: NEGATIVE
Nitrite, UA: NEGATIVE
Protein, UA: NEGATIVE

## 2018-01-31 NOTE — Progress Notes (Signed)
Miranda Williams 47 y.o.  Vitals:   01/31/18 1608  BP: 140/90  Pulse: 92     Filed Weights   01/31/18 1608  Weight: 189 lb 4.8 oz (85.9 kg)    Past Medical History: Past Medical History:  Diagnosis Date  . Breast disorder   . Diabetes mellitus without complication ([place])   . Dystrophic radiologic calcification 03/05/2014   Right breast; removed 02/2013; benign  . Gallstones   . Hyperlipidemia   . Hypertension     Past Surgical History: Past Surgical History:  Procedure Laterality Date  . CHOLECYSTECTOMY    . NASAL SINUS SURGERY     removal of benign tumor    Family History: Family History  Problem Relation Age of Onset  . Cancer Mother        liver  . Cancer Father        colon cancer    Social History: Social History   Tobacco Use  . Smoking status: Never Smoker  . Smokeless tobacco: Never Used  Substance Use Topics  . Alcohol use: Yes    Alcohol/week: 0.0 oz    Comment: socially  . Drug use: No    Allergies:  Allergies  Allergen Reactions  . Sulfa Antibiotics Swelling, Rash and Other (See Comments)    fever      Current Outpatient Medications:  .  levonorgestrel (MIRENA) 20 MCG/24HR IUD, 1 each by Intrauterine route once., Disp: , Rfl:  .  clonazePAM (KLONOPIN) 0.5 MG tablet, Take 1 tablet (0.5 mg total) by mouth 2 (two) times daily as needed for anxiety. (Patient not taking: Reported on 01/31/2018), Disp: 30 tablet, Rfl: 0 .  Diethylpropion HCl 25 MG TABS, 1 po 1 hour prior to meals ONCE DAILY (Patient not taking: Reported on 01/31/2018), Disp: 30 each, Rfl: 0 .  predniSONE (STERAPRED UNI-PAK 21 TAB) 10 MG (21) TBPK tablet, Take following package directions. (Patient not taking: Reported on 01/31/2018), Disp: 21 tablet, Rfl: 0  History of Present Illness: here for Pap and physical. Last pap 18 months ago, ASCUS/HPV.  Colpo neg.  Had some suprapubic pain "sharp" in nature, lasting only a second.  Happened several times, ? UTI. Still has some postcoital  bleeding.  No sx vag infection.  Has monogamous partner, declines STD testing.  Needs mammogram (watching a spot on right breast, "probably benign").     Review of Systems   Patient denies any headaches, blurred vision, shortness of breath, chest pain, abdominal pain, problems with bowel movements, urination, or intercourse.   Physical Exam: General:  Well developed, well nourished, no acute distress Skin:  Warm and dry Neck:  Midline trachea, normal thyroid Lungs; Clear to auscultation bilaterally Breast:  No dominant palpable mass, retraction, or nipple discharge Cardiovascular: Regular rate and rhythm Abdomen:  Soft, non tender, no hepatosplenomegaly Pelvic:  External genitalia is normal in appearance.  The vagina is normal in appearance.  The cervix is bulbous. IUD strings short but visible. Uterus is felt to be normal size, shape, and contour.  No adnexal masses or tenderness noted.  Extremities:  No swelling or varicosities noted Psych:  No mood changes.     Impression: Normal GYN exam BTB/spotting d/t IUD Intermittent, mild bladder pain ? neurogenic     Plan: If pap neg, repeat in 3 years If pap still + HPV, colpo  Screening labs Urine culture

## 2018-02-01 ENCOUNTER — Other Ambulatory Visit: Payer: Self-pay | Admitting: *Deleted

## 2018-02-01 ENCOUNTER — Telehealth: Payer: Self-pay | Admitting: *Deleted

## 2018-02-01 ENCOUNTER — Encounter (INDEPENDENT_AMBULATORY_CARE_PROVIDER_SITE_OTHER): Payer: Self-pay

## 2018-02-01 DIAGNOSIS — Z01419 Encounter for gynecological examination (general) (routine) without abnormal findings: Secondary | ICD-10-CM | POA: Diagnosis not present

## 2018-02-01 DIAGNOSIS — Z1329 Encounter for screening for other suspected endocrine disorder: Secondary | ICD-10-CM | POA: Diagnosis not present

## 2018-02-01 DIAGNOSIS — Z13220 Encounter for screening for lipoid disorders: Secondary | ICD-10-CM | POA: Diagnosis not present

## 2018-02-02 ENCOUNTER — Other Ambulatory Visit (HOSPITAL_COMMUNITY): Payer: Self-pay | Admitting: Advanced Practice Midwife

## 2018-02-02 ENCOUNTER — Other Ambulatory Visit: Payer: Self-pay

## 2018-02-02 ENCOUNTER — Telehealth: Payer: Self-pay | Admitting: *Deleted

## 2018-02-02 DIAGNOSIS — R928 Other abnormal and inconclusive findings on diagnostic imaging of breast: Secondary | ICD-10-CM

## 2018-02-02 DIAGNOSIS — Z09 Encounter for follow-up examination after completed treatment for conditions other than malignant neoplasm: Secondary | ICD-10-CM

## 2018-02-02 DIAGNOSIS — N6489 Other specified disorders of breast: Secondary | ICD-10-CM

## 2018-02-02 LAB — COMPREHENSIVE METABOLIC PANEL
A/G RATIO: 1.8 (ref 1.2–2.2)
ALBUMIN: 4.6 g/dL (ref 3.5–5.5)
ALT: 18 IU/L (ref 0–32)
AST: 13 IU/L (ref 0–40)
Alkaline Phosphatase: 72 IU/L (ref 39–117)
BILIRUBIN TOTAL: 0.5 mg/dL (ref 0.0–1.2)
BUN / CREAT RATIO: 14 (ref 9–23)
BUN: 10 mg/dL (ref 6–24)
CHLORIDE: 103 mmol/L (ref 96–106)
CO2: 19 mmol/L — ABNORMAL LOW (ref 20–29)
Calcium: 9.4 mg/dL (ref 8.7–10.2)
Creatinine, Ser: 0.72 mg/dL (ref 0.57–1.00)
GFR, EST AFRICAN AMERICAN: 115 mL/min/{1.73_m2} (ref >59)
GFR, EST NON AFRICAN AMERICAN: 99 mL/min/{1.73_m2} (ref >59)
GLOBULIN, TOTAL: 2.6 g/dL (ref 1.5–4.5)
Glucose: 94 mg/dL (ref 65–99)
POTASSIUM: 4.4 mmol/L (ref 3.5–5.2)
Sodium: 136 mmol/L (ref 134–144)
TOTAL PROTEIN: 7.2 g/dL (ref 6.0–8.5)

## 2018-02-02 LAB — TSH: TSH: 1.65 u[IU]/mL (ref 0.450–4.500)

## 2018-02-02 LAB — CBC
HEMATOCRIT: 39.8 % (ref 34.0–46.6)
HEMOGLOBIN: 13.7 g/dL (ref 11.1–15.9)
MCH: 29.4 pg (ref 26.6–33.0)
MCHC: 34.4 g/dL (ref 31.5–35.7)
MCV: 85 fL (ref 79–97)
Platelets: 292 10*3/uL (ref 150–379)
RBC: 4.66 x10E6/uL (ref 3.77–5.28)
RDW: 13.6 % (ref 12.3–15.4)
WBC: 5.1 10*3/uL (ref 3.4–10.8)

## 2018-02-02 LAB — LIPID PANEL
Chol/HDL Ratio: 4.8 ratio — ABNORMAL HIGH (ref 0.0–4.4)
Cholesterol, Total: 220 mg/dL — ABNORMAL HIGH (ref 100–199)
HDL: 46 mg/dL (ref >39)
LDL Calculated: 154 mg/dL — ABNORMAL HIGH (ref 0–99)
Triglycerides: 100 mg/dL (ref 0–149)
VLDL Cholesterol Cal: 20 mg/dL (ref 5–40)

## 2018-02-02 LAB — CYTOLOGY - PAP
Diagnosis: NEGATIVE
HPV: DETECTED — AB

## 2018-02-02 NOTE — Telephone Encounter (Signed)
LMOVM regarding getting mammograph ordered and scheduled.

## 2018-02-02 NOTE — Telephone Encounter (Signed)
Orders entered under wrong encounter.

## 2018-02-02 NOTE — Telephone Encounter (Signed)
Patient states she has not had a f/u since 1/18.  Informed patient she will need diagnostic mammogram so I will get that scheduled for her and call back. Patient also informed urine culture not sent but patient states she doesn't think she needs it done.

## 2018-02-07 ENCOUNTER — Encounter (INDEPENDENT_AMBULATORY_CARE_PROVIDER_SITE_OTHER): Payer: Self-pay

## 2018-02-13 ENCOUNTER — Encounter (HOSPITAL_COMMUNITY): Payer: Self-pay

## 2018-02-13 ENCOUNTER — Ambulatory Visit (HOSPITAL_COMMUNITY)
Admission: RE | Admit: 2018-02-13 | Discharge: 2018-02-13 | Disposition: A | Discharge status: Home / Self Care | Payer: 59 | Source: Ambulatory Visit | Attending: Advanced Practice Midwife | Admitting: Advanced Practice Midwife

## 2018-02-13 ENCOUNTER — Encounter (HOSPITAL_COMMUNITY): Payer: PRIVATE HEALTH INSURANCE

## 2018-02-13 DIAGNOSIS — R928 Other abnormal and inconclusive findings on diagnostic imaging of breast: Secondary | ICD-10-CM | POA: Diagnosis not present

## 2018-02-13 DIAGNOSIS — Z09 Encounter for follow-up examination after completed treatment for conditions other than malignant neoplasm: Secondary | ICD-10-CM

## 2018-02-13 DIAGNOSIS — N6489 Other specified disorders of breast: Secondary | ICD-10-CM | POA: Diagnosis not present

## 2018-02-28 ENCOUNTER — Other Ambulatory Visit: Payer: 59 | Admitting: Advanced Practice Midwife

## 2018-03-22 ENCOUNTER — Telehealth: Payer: Self-pay | Admitting: Gastroenterology

## 2018-03-22 MED ORDER — DIETHYLPROPION HCL 25 MG PO TABS
ORAL_TABLET | ORAL | 0 refills | Status: DC
Start: 2018-03-22 — End: 2018-11-19

## 2018-03-22 NOTE — Telephone Encounter (Signed)
PT WEIGHS 183 LBS, BMI 32.9. Need refill on [place]. Apr 2019: BP 128-82 HR 88.

## 2018-05-15 DIAGNOSIS — Z1331 Encounter for screening for depression: Secondary | ICD-10-CM | POA: Diagnosis not present

## 2018-05-15 DIAGNOSIS — Z1389 Encounter for screening for other disorder: Secondary | ICD-10-CM | POA: Diagnosis not present

## 2018-05-15 DIAGNOSIS — Z6833 Body mass index (BMI) 33.0-33.9, adult: Secondary | ICD-10-CM | POA: Diagnosis not present

## 2018-05-15 DIAGNOSIS — R4681 Obsessive-compulsive behavior: Secondary | ICD-10-CM | POA: Diagnosis not present

## 2018-05-15 MED FILL — SERTRALINE HCL 25 MG TABLET: 25 | 30 days supply | Qty: 30 | Fill #0

## 2018-06-06 DIAGNOSIS — Z6833 Body mass index (BMI) 33.0-33.9, adult: Secondary | ICD-10-CM | POA: Diagnosis not present

## 2018-06-06 DIAGNOSIS — R4681 Obsessive-compulsive behavior: Secondary | ICD-10-CM | POA: Diagnosis not present

## 2018-06-06 DIAGNOSIS — F419 Anxiety disorder, unspecified: Secondary | ICD-10-CM | POA: Diagnosis not present

## 2018-06-06 MED FILL — SERTRALINE HCL 50 MG TABLET: 50 | 90 days supply | Qty: 90 | Fill #0

## 2018-07-17 DIAGNOSIS — I83813 Varicose veins of bilateral lower extremities with pain: Secondary | ICD-10-CM | POA: Diagnosis not present

## 2018-07-17 DIAGNOSIS — I83893 Varicose veins of bilateral lower extremities with other complications: Secondary | ICD-10-CM | POA: Diagnosis not present

## 2018-08-14 DIAGNOSIS — I83893 Varicose veins of bilateral lower extremities with other complications: Secondary | ICD-10-CM | POA: Diagnosis not present

## 2018-08-14 DIAGNOSIS — I83813 Varicose veins of bilateral lower extremities with pain: Secondary | ICD-10-CM | POA: Diagnosis not present

## 2018-08-15 DIAGNOSIS — I83813 Varicose veins of bilateral lower extremities with pain: Secondary | ICD-10-CM | POA: Diagnosis not present

## 2018-08-15 DIAGNOSIS — I83893 Varicose veins of bilateral lower extremities with other complications: Secondary | ICD-10-CM | POA: Diagnosis not present

## 2018-09-08 DIAGNOSIS — E669 Obesity, unspecified: Secondary | ICD-10-CM | POA: Diagnosis not present

## 2018-09-08 DIAGNOSIS — R4681 Obsessive-compulsive behavior: Secondary | ICD-10-CM | POA: Diagnosis not present

## 2018-09-08 DIAGNOSIS — F419 Anxiety disorder, unspecified: Secondary | ICD-10-CM | POA: Diagnosis not present

## 2018-09-08 DIAGNOSIS — Z6835 Body mass index (BMI) 35.0-35.9, adult: Secondary | ICD-10-CM | POA: Diagnosis not present

## 2018-09-10 MED FILL — SERTRALINE HCL 100 MG TAB: 100 | 90 days supply | Qty: 90 | Fill #0

## 2018-11-09 DIAGNOSIS — I83812 Varicose veins of left lower extremities with pain: Secondary | ICD-10-CM | POA: Diagnosis not present

## 2018-11-09 DIAGNOSIS — I83811 Varicose veins of right lower extremities with pain: Secondary | ICD-10-CM | POA: Diagnosis not present

## 2018-11-09 DIAGNOSIS — I8312 Varicose veins of left lower extremity with inflammation: Secondary | ICD-10-CM | POA: Diagnosis not present

## 2018-11-09 DIAGNOSIS — I8311 Varicose veins of right lower extremity with inflammation: Secondary | ICD-10-CM | POA: Diagnosis not present

## 2018-11-12 DIAGNOSIS — I83811 Varicose veins of right lower extremities with pain: Secondary | ICD-10-CM | POA: Diagnosis not present

## 2018-11-12 DIAGNOSIS — I8311 Varicose veins of right lower extremity with inflammation: Secondary | ICD-10-CM | POA: Diagnosis not present

## 2018-11-12 DIAGNOSIS — I83812 Varicose veins of left lower extremities with pain: Secondary | ICD-10-CM | POA: Diagnosis not present

## 2018-11-12 DIAGNOSIS — I8312 Varicose veins of left lower extremity with inflammation: Secondary | ICD-10-CM | POA: Diagnosis not present

## 2018-11-14 DIAGNOSIS — I8311 Varicose veins of right lower extremity with inflammation: Secondary | ICD-10-CM | POA: Diagnosis not present

## 2018-11-14 DIAGNOSIS — I83811 Varicose veins of right lower extremities with pain: Secondary | ICD-10-CM | POA: Diagnosis not present

## 2018-11-19 ENCOUNTER — Telehealth: Payer: Self-pay | Admitting: Gastroenterology

## 2018-11-19 MED ORDER — DIETHYLPROPION HCL 25 MG PO TABS: ORAL_TABLET | ORAL | 0 refills | Status: DC

## 2018-11-19 NOTE — Telephone Encounter (Signed)
PT NEED REFILL ON TENUATE. NOW ON WEIGHT WATCHERS. SO FAR HAS LOST 9 LBS.

## 2018-11-22 ENCOUNTER — Telehealth: Payer: Self-pay | Admitting: *Deleted

## 2018-11-22 MED FILL — SERTRALINE HCL 100 MG TAB: 100 | 90 days supply | Qty: 90 | Fill #1

## 2018-11-22 NOTE — Telephone Encounter (Signed)
Pharmacy called stating patient brought in Rx for diethylpropion but on Rx it did not have any doctor information and date wrote on it was for 11/19/2017. They need updated rx given to patient to be brought back in. Sending to [facility] and Dr. Oneida Alar.

## 2018-11-23 NOTE — Telephone Encounter (Signed)
Rx written and given to pt.

## 2018-11-26 DIAGNOSIS — I8311 Varicose veins of right lower extremity with inflammation: Secondary | ICD-10-CM | POA: Diagnosis not present

## 2018-11-26 DIAGNOSIS — M7981 Nontraumatic hematoma of soft tissue: Secondary | ICD-10-CM | POA: Diagnosis not present

## 2018-11-26 DIAGNOSIS — I83891 Varicose veins of right lower extremities with other complications: Secondary | ICD-10-CM | POA: Diagnosis not present

## 2018-11-26 NOTE — Telephone Encounter (Signed)
Noted  

## 2018-12-07 DIAGNOSIS — I8311 Varicose veins of right lower extremity with inflammation: Secondary | ICD-10-CM | POA: Diagnosis not present

## 2018-12-07 DIAGNOSIS — I83891 Varicose veins of right lower extremities with other complications: Secondary | ICD-10-CM | POA: Diagnosis not present

## 2018-12-18 ENCOUNTER — Encounter (INDEPENDENT_AMBULATORY_CARE_PROVIDER_SITE_OTHER): Payer: Self-pay | Admitting: *Deleted

## 2018-12-20 DIAGNOSIS — J111 Influenza due to unidentified influenza virus with other respiratory manifestations: Secondary | ICD-10-CM | POA: Diagnosis not present

## 2018-12-20 DIAGNOSIS — Z6834 Body mass index (BMI) 34.0-34.9, adult: Secondary | ICD-10-CM | POA: Diagnosis not present

## 2018-12-20 DIAGNOSIS — J069 Acute upper respiratory infection, unspecified: Secondary | ICD-10-CM | POA: Diagnosis not present

## 2019-01-01 DIAGNOSIS — I8312 Varicose veins of left lower extremity with inflammation: Secondary | ICD-10-CM | POA: Diagnosis not present

## 2019-01-01 DIAGNOSIS — I83812 Varicose veins of left lower extremities with pain: Secondary | ICD-10-CM | POA: Diagnosis not present

## 2019-01-02 ENCOUNTER — Ambulatory Visit (HOSPITAL_COMMUNITY)
Admission: RE | Admit: 2019-01-02 | Discharge: 2019-01-02 | Disposition: A | Discharge status: Home / Self Care | Payer: 59 | Source: Ambulatory Visit | Attending: Gastroenterology | Admitting: Gastroenterology

## 2019-01-02 ENCOUNTER — Telehealth: Payer: Self-pay | Admitting: Gastroenterology

## 2019-01-02 DIAGNOSIS — R059 Cough, unspecified: Secondary | ICD-10-CM

## 2019-01-02 DIAGNOSIS — R05 Cough: Secondary | ICD-10-CM | POA: Insufficient documentation

## 2019-01-02 MED ORDER — PREDNISONE 5 MG PO TABS: ORAL_TABLET | ORAL | 0 refills | Status: DC

## 2019-01-02 NOTE — Telephone Encounter (Signed)
PT SEEN AND EXAMINED. Having cough and chest pain. LUNGS: CLEAR WITH COUGH ELICITED WITH FORCEFUL EXPIRATION. DX: REACTIVE AIRWAY DISEASE/POSTINFECTIOUS COUGH.Order CXR and add prednisone if no PNA.

## 2019-01-04 DIAGNOSIS — I8312 Varicose veins of left lower extremity with inflammation: Secondary | ICD-10-CM | POA: Diagnosis not present

## 2019-01-04 DIAGNOSIS — I83812 Varicose veins of left lower extremities with pain: Secondary | ICD-10-CM | POA: Diagnosis not present

## 2019-01-07 ENCOUNTER — Other Ambulatory Visit (INDEPENDENT_AMBULATORY_CARE_PROVIDER_SITE_OTHER): Payer: Self-pay | Admitting: *Deleted

## 2019-01-07 DIAGNOSIS — Z86010 Personal history of colonic polyps: Secondary | ICD-10-CM

## 2019-01-07 DIAGNOSIS — Z800 Family history of malignant neoplasm of digestive organs: Secondary | ICD-10-CM | POA: Insufficient documentation

## 2019-01-14 DIAGNOSIS — I83812 Varicose veins of left lower extremities with pain: Secondary | ICD-10-CM | POA: Diagnosis not present

## 2019-01-14 DIAGNOSIS — I8312 Varicose veins of left lower extremity with inflammation: Secondary | ICD-10-CM | POA: Diagnosis not present

## 2019-01-17 ENCOUNTER — Other Ambulatory Visit (HOSPITAL_COMMUNITY): Payer: Self-pay | Admitting: Advanced Practice Midwife

## 2019-01-17 DIAGNOSIS — Z1231 Encounter for screening mammogram for malignant neoplasm of breast: Secondary | ICD-10-CM

## 2019-01-23 MED FILL — SERTRALINE HCL 100 MG TAB: 100 | 90 days supply | Qty: 90 | Fill #0

## 2019-01-25 DIAGNOSIS — I83812 Varicose veins of left lower extremities with pain: Secondary | ICD-10-CM | POA: Diagnosis not present

## 2019-01-25 DIAGNOSIS — I8312 Varicose veins of left lower extremity with inflammation: Secondary | ICD-10-CM | POA: Diagnosis not present

## 2019-01-25 DIAGNOSIS — M7981 Nontraumatic hematoma of soft tissue: Secondary | ICD-10-CM | POA: Diagnosis not present

## 2019-02-25 ENCOUNTER — Ambulatory Visit (HOSPITAL_COMMUNITY): Payer: 59

## 2019-03-06 ENCOUNTER — Encounter (INDEPENDENT_AMBULATORY_CARE_PROVIDER_SITE_OTHER): Payer: Self-pay | Admitting: *Deleted

## 2019-03-06 ENCOUNTER — Telehealth (INDEPENDENT_AMBULATORY_CARE_PROVIDER_SITE_OTHER): Payer: Self-pay | Admitting: *Deleted

## 2019-03-06 NOTE — Telephone Encounter (Signed)
Patient needs suprep 

## 2019-03-07 MED ORDER — SUPREP BOWEL PREP KIT 17.5-3.13-1.6 GM/177ML PO SOLN: 1.0000 | Freq: Once | ORAL | 0 refills | Status: AC

## 2019-03-08 MED FILL — SUPREP BOWEL PREP KIT: 17.5-3.13-1 | 1 days supply | Qty: 354 | Fill #0

## 2019-03-11 ENCOUNTER — Other Ambulatory Visit: Payer: Self-pay

## 2019-03-11 ENCOUNTER — Ambulatory Visit (HOSPITAL_COMMUNITY)
Admission: RE | Admit: 2019-03-11 | Discharge: 2019-03-11 | Disposition: A | Discharge status: Home / Self Care | Payer: 59 | Source: Ambulatory Visit | Attending: Advanced Practice Midwife | Admitting: Advanced Practice Midwife

## 2019-03-11 DIAGNOSIS — Z1231 Encounter for screening mammogram for malignant neoplasm of breast: Secondary | ICD-10-CM | POA: Diagnosis not present

## 2019-03-12 DIAGNOSIS — M7981 Nontraumatic hematoma of soft tissue: Secondary | ICD-10-CM | POA: Diagnosis not present

## 2019-03-12 DIAGNOSIS — I8312 Varicose veins of left lower extremity with inflammation: Secondary | ICD-10-CM | POA: Diagnosis not present

## 2019-03-27 ENCOUNTER — Encounter (INDEPENDENT_AMBULATORY_CARE_PROVIDER_SITE_OTHER): Payer: Self-pay

## 2019-04-01 ENCOUNTER — Telehealth (INDEPENDENT_AMBULATORY_CARE_PROVIDER_SITE_OTHER): Payer: Self-pay | Admitting: *Deleted

## 2019-04-01 NOTE — Telephone Encounter (Signed)
Referring MD/PCP: Denny Levy, pa -- [facility]   Procedure: tcs  Reason/Indication:  Hx polyps, fam hx colon ca  Has patient had this procedure before?  Yes, 2010  If so, when, by whom and where?    Is there a family history of colon cancer?  Yes, parents  Who?  What age when diagnosed?    Is patient diabetic?   no      Does patient have prosthetic heart valve or mechanical valve?  no  Do you have a pacemaker?  no  Has patient ever had endocarditis? no  Has patient had joint replacement within last 12 months?  no  Is patient constipated or do they take laxatives? no  Does patient have a history of alcohol/drug use?  no  Is patient on blood thinner such as Coumadin, Plavix and/or Aspirin? no  Medications: sertraline 100 mg daily  Allergies: sulfa  Medication Adjustment per Dr Lindi Adie, NP:   Procedure date & time: 04/18/19 at 1030

## 2019-04-01 NOTE — Telephone Encounter (Signed)
agree

## 2019-04-03 DIAGNOSIS — H5203 Hypermetropia, bilateral: Secondary | ICD-10-CM | POA: Diagnosis not present

## 2019-04-04 DIAGNOSIS — I8312 Varicose veins of left lower extremity with inflammation: Secondary | ICD-10-CM | POA: Diagnosis not present

## 2019-04-04 DIAGNOSIS — I83812 Varicose veins of left lower extremities with pain: Secondary | ICD-10-CM | POA: Diagnosis not present

## 2019-04-11 ENCOUNTER — Other Ambulatory Visit (HOSPITAL_COMMUNITY): Payer: 59

## 2019-04-11 ENCOUNTER — Other Ambulatory Visit: Payer: Self-pay

## 2019-04-11 ENCOUNTER — Other Ambulatory Visit (HOSPITAL_COMMUNITY)
Admission: RE | Admit: 2019-04-11 | Discharge: 2019-04-11 | Disposition: A | Discharge status: Home / Self Care | Payer: 59 | Source: Ambulatory Visit | Attending: Internal Medicine | Admitting: Internal Medicine

## 2019-04-11 DIAGNOSIS — Z1159 Encounter for screening for other viral diseases: Secondary | ICD-10-CM | POA: Insufficient documentation

## 2019-04-12 ENCOUNTER — Other Ambulatory Visit (HOSPITAL_COMMUNITY): Payer: 59

## 2019-04-12 LAB — NOVEL CORONAVIRUS, NAA (HOSP ORDER, SEND-OUT TO REF LAB; TAT 18-24 HRS): SARS-CoV-2, NAA: NOT DETECTED

## 2019-04-15 ENCOUNTER — Ambulatory Visit (HOSPITAL_COMMUNITY)
Admission: RE | Admit: 2019-04-15 | Discharge: 2019-04-15 | Disposition: A | Discharge status: Home / Self Care | Payer: 59 | Attending: Internal Medicine | Admitting: Internal Medicine

## 2019-04-15 ENCOUNTER — Encounter (HOSPITAL_COMMUNITY)
Admission: RE | Disposition: A | Discharge status: Home / Self Care | Payer: Self-pay | Source: Home / Self Care | Attending: Internal Medicine

## 2019-04-15 ENCOUNTER — Encounter (HOSPITAL_COMMUNITY): Payer: Self-pay | Admitting: *Deleted

## 2019-04-15 ENCOUNTER — Other Ambulatory Visit: Payer: Self-pay

## 2019-04-15 DIAGNOSIS — D120 Benign neoplasm of cecum: Secondary | ICD-10-CM | POA: Diagnosis not present

## 2019-04-15 DIAGNOSIS — Z79899 Other long term (current) drug therapy: Secondary | ICD-10-CM | POA: Diagnosis not present

## 2019-04-15 DIAGNOSIS — E119 Type 2 diabetes mellitus without complications: Secondary | ICD-10-CM | POA: Diagnosis not present

## 2019-04-15 DIAGNOSIS — Z6834 Body mass index (BMI) 34.0-34.9, adult: Secondary | ICD-10-CM | POA: Diagnosis not present

## 2019-04-15 DIAGNOSIS — Z793 Long term (current) use of hormonal contraceptives: Secondary | ICD-10-CM | POA: Diagnosis not present

## 2019-04-15 DIAGNOSIS — Z860100 Personal history of colon polyps, unspecified: Secondary | ICD-10-CM | POA: Insufficient documentation

## 2019-04-15 DIAGNOSIS — K5730 Diverticulosis of large intestine without perforation or abscess without bleeding: Secondary | ICD-10-CM | POA: Diagnosis not present

## 2019-04-15 DIAGNOSIS — Z800 Family history of malignant neoplasm of digestive organs: Secondary | ICD-10-CM | POA: Insufficient documentation

## 2019-04-15 DIAGNOSIS — Z1211 Encounter for screening for malignant neoplasm of colon: Secondary | ICD-10-CM | POA: Diagnosis not present

## 2019-04-15 DIAGNOSIS — K5790 Diverticulosis of intestine, part unspecified, without perforation or abscess without bleeding: Secondary | ICD-10-CM | POA: Diagnosis not present

## 2019-04-15 DIAGNOSIS — E669 Obesity, unspecified: Secondary | ICD-10-CM | POA: Insufficient documentation

## 2019-04-15 DIAGNOSIS — Z09 Encounter for follow-up examination after completed treatment for conditions other than malignant neoplasm: Secondary | ICD-10-CM | POA: Diagnosis not present

## 2019-04-15 HISTORY — PX: POLYPECTOMY: SHX5525

## 2019-04-15 HISTORY — PX: COLONOSCOPY: SHX5424

## 2019-04-15 SURGERY — COLONOSCOPY
Anesthesia: Moderate Sedation

## 2019-04-15 MED ORDER — MEPERIDINE HCL 50 MG/ML IJ SOLN
INTRAMUSCULAR | Status: AC
  Filled 2019-04-15: qty 1

## 2019-04-15 MED ORDER — MEPERIDINE HCL 50 MG/ML IJ SOLN
INTRAMUSCULAR | Status: DC | PRN
  Administered 2019-04-15 (×3): 25 mg via INTRAVENOUS

## 2019-04-15 MED ORDER — SODIUM CHLORIDE 0.9 % IV SOLN
INTRAVENOUS | Status: DC
  Administered 2019-04-15: 07:00:00 via INTRAVENOUS

## 2019-04-15 MED ORDER — MIDAZOLAM HCL 5 MG/5ML IJ SOLN
INTRAMUSCULAR | Status: DC | PRN
  Administered 2019-04-15 (×2): 2 mg via INTRAVENOUS
  Administered 2019-04-15: 3 mg via INTRAVENOUS
  Administered 2019-04-15: 2 mg via INTRAVENOUS

## 2019-04-15 MED ORDER — MIDAZOLAM HCL 5 MG/5ML IJ SOLN
INTRAMUSCULAR | Status: AC
  Filled 2019-04-15: qty 10

## 2019-04-15 MED ORDER — STERILE WATER FOR IRRIGATION IR SOLN
Status: DC | PRN
  Administered 2019-04-15: 08:00:00 5 mL

## 2019-04-15 NOTE — Op Note (Signed)
[facility] Patient Name: Miranda Williams Procedure Date: 04/15/2019 7:20 AM MRN: [id number] Date of Birth: 1971/04/12 Attending MD: Hildred Laser , MD CSN: [id number] Age: 48 Admit Type: Outpatient Procedure:                Colonoscopy Indications:              High risk colon cancer surveillance: Personal                            history of colonic polyps, Family history of colon                            cancer in multiple first-degree relatives Providers:                Hildred Laser, MD, Otis Peak B. Sharon Seller, RN, Nelma Rothman, Technician Referring MD:             Denny Levy, PA Medicines:                Meperidine 75 mg IV, Midazolam 9 mg IV Complications:            No immediate complications. Estimated Blood Loss:     Estimated blood loss was minimal. Procedure:                Pre-Anesthesia Assessment:                           - Prior to the procedure, a History and Physical                            was performed, and patient medications and                            allergies were reviewed. The patient's tolerance of                            previous anesthesia was also reviewed. The risks                            and benefits of the procedure and the sedation                            options and risks were discussed with the patient.                            All questions were answered, and informed consent                            was obtained. Prior Anticoagulants: The patient has                            taken no previous anticoagulant or antiplatelet  agents. ASA Grade Assessment: II - A patient with                            mild systemic disease. After reviewing the risks                            and benefits, the patient was deemed in                            satisfactory condition to undergo the procedure.                           After obtaining informed consent, the colonoscope                      was passed under direct vision. Throughout the                            procedure, the patient's blood pressure, pulse, and                            oxygen saturations were monitored continuously. The                            [id number]-H190DL ([id number]) scope was introduced through                            the anus and advanced to the the cecum, identified                            by appendiceal orifice and ileocecal valve. The                            colonoscopy was performed without difficulty. The                            patient tolerated the procedure well. The quality                            of the bowel preparation was good. The ileocecal                            valve, appendiceal orifice, and rectum were                            photographed. Scope In: 7:41:38 AM Scope Out: 8:05:03 AM Scope Withdrawal Time: 0 hours 13 minutes 9 seconds  Total Procedure Duration: 0 hours 23 minutes 25 seconds  Findings:      The perianal and digital rectal examinations were normal.      Two sessile polyps were found in the cecum. The polyps were small in       size. These were biopsied with a cold forceps for histology. The       pathology specimen was placed into Bottle Number 1.      A  few diverticula were found in the sigmoid colon.      The retroflexed view of the distal rectum and anal verge was normal and       showed no anal or rectal abnormalities. Impression:               - Two small polyps in the cecum. Biopsied.                           - Diverticulosis in the sigmoid colon. Moderate Sedation:      Moderate (conscious) sedation was administered by the endoscopy nurse       and supervised by the endoscopist. The following parameters were       monitored: oxygen saturation, heart rate, blood pressure, CO2       capnography and response to care. Total physician intraservice time was       31 minutes. Recommendation:           - Patient has a contact  number available for                            emergencies. The signs and symptoms of potential                            delayed complications were discussed with the                            patient. Return to normal activities tomorrow.                            Written discharge instructions were provided to the                            patient.                           - High fiber diet today.                           - Continue present medications.                           - No aspirin, ibuprofen, naproxen, or other                            non-steroidal anti-inflammatory drugs for 1 day.                           - Await pathology results.                           - Repeat colonoscopy in 5 years for surveillance. Procedure Code(s):        --- Professional ---                           [phone], Colonoscopy, flexible; with biopsy, single  or multiple                           99153, Moderate sedation; each additional 15                            minutes intraservice time                           G0500, Moderate sedation services provided by the                            same physician or other qualified health care                            professional performing a gastrointestinal                            endoscopic service that sedation supports,                            requiring the presence of an independent trained                            observer to assist in the monitoring of the                            patient's level of consciousness and physiological                            status; initial 15 minutes of intra-service time;                            patient age 54 years or older (additional time may                            be reported with [phone], as appropriate) Diagnosis Code(s):        --- Professional ---                           Z86.010, Personal history of colonic polyps                           K63.5, Polyp  of colon                           Z80.0, Family history of malignant neoplasm of                            digestive organs                           K57.30, Diverticulosis of large intestine without                            perforation or abscess without bleeding  CPT copyright 2019 [employer] Association. All rights reserved. The codes documented in this report are preliminary and upon coder review may  be revised to meet current compliance requirements. Hildred Laser, MD Hildred Laser, MD 04/15/2019 8:17:14 AM This report has been signed electronically. Number of Addenda: 0

## 2019-04-15 NOTE — H&P (Addendum)
Miranda Williams is an 48 y.o. female.   Chief Complaint: Patient is here for colonoscopy. HPI: Patient is 48 year old Caucasian female who is here for surveillance colonoscopy.  She denies abdominal pain change in bowel habits or rectal bleeding.  Her first colonoscopy was 10 years ago and second colonoscopy was 5 years ago. Raquel Sarna history significant for CRC in both parents.  Her father was diagnosed with rectal adenocarcinoma at age 63 and died at 66 of cardiac disease.  Mother was diagnosed with stage IV CRC at age 50 and lived to be 5 years.  Past Medical History:  Diagnosis Date  . Breast disorder   . Diabetes mellitus without complication [facility]): Blood glucose levels have been normal since weight loss   . Dystrophic radiologic calcification 03/05/2014   Right breast; removed 02/2013; benign  . Gallstones   . Hyperlipidemia   . Obesity     Past Surgical History:  Procedure Laterality Date  . CHOLECYSTECTOMY    . NASAL SINUS SURGERY     removal of benign tumor    Family History  Problem Relation Age of Onset  . Cancer Mother        liver  . Cancer Father        colon cancer   Social History:  reports that she has never smoked. She has never used smokeless tobacco. She reports current alcohol use. She reports that she does not use drugs.  Allergies:  Allergies  Allergen Reactions  . Sulfa Antibiotics Swelling, Rash and Other (See Comments)    fever    Medications Prior to Admission  Medication Sig Dispense Refill  . levonorgestrel (MIRENA) 20 MCG/24HR IUD 1 each by Intrauterine route once.    . sertraline (ZOLOFT) 100 MG tablet Take 100 mg by mouth at bedtime.      No results found for this or any previous visit (from the past 48 hour(s)). No results found.  ROS  Blood pressure 126/76, pulse 71, temperature 98 F (36.7 C), temperature source Oral, resp. rate 18, height 5' 2.5" (1.588 m), weight 85.7 kg, SpO2 100 %. Physical Exam  Constitutional: She appears  well-developed and well-nourished.  HENT:  Mouth/Throat: Oropharynx is clear and moist.  Eyes: Conjunctivae are normal.  Neck: No thyromegaly present.  Cardiovascular: Normal rate, regular rhythm and normal heart sounds.  No murmur heard. Respiratory: Effort normal and breath sounds normal.  GI:  Abdomen is full.  Soft and nontender with organomegaly or masses.  Musculoskeletal:        General: No edema.  Lymphadenopathy:    She has no cervical adenopathy.  Neurological: She is alert.  Skin: Skin is warm and dry.     Assessment/Plan History of colonic adenoma. Family history of CRC in both parents. Surveillance colonoscopy.  Hildred Laser, MD 04/15/2019, 7:24 AM

## 2019-04-15 NOTE — Discharge Instructions (Signed)
Colon Polyps  Polyps are tissue growths inside the body. Polyps can grow in many places, including the large intestine (colon). A polyp may be a round bump or a mushroom-shaped growth. You could have one polyp or several. Most colon polyps are noncancerous (benign). However, some colon polyps can become cancerous over time. Finding and removing the polyps early can help prevent this. What are the causes? The exact cause of colon polyps is not known. What increases the risk? You are more likely to develop this condition if you:  Have a family history of colon cancer or colon polyps.  Are older than 81 or older than 45 if you are African American.  Have inflammatory bowel disease, such as ulcerative colitis or Crohn's disease.  Have certain hereditary conditions, such as: ? Familial adenomatous polyposis. ? Lynch syndrome. ? Turcot syndrome. ? Peutz-Jeghers syndrome.  Are overweight.  Smoke cigarettes.  Do not get enough exercise.  Drink too much alcohol.  Eat a diet that is high in fat and red meat and low in fiber.  Had childhood cancer that was treated with abdominal radiation. What are the signs or symptoms? Most polyps do not cause symptoms. If you have symptoms, they may include:  Blood coming from your rectum when having a bowel movement.  Blood in your stool. The stool may look dark red or black.  Abdominal pain.  A change in bowel habits, such as constipation or diarrhea. How is this diagnosed? This condition is diagnosed with a colonoscopy. This is a procedure in which a lighted, flexible scope is inserted into the anus and then passed into the colon to examine the area. Polyps are sometimes found when a colonoscopy is done as part of routine cancer screening tests. How is this treated? Treatment for this condition involves removing any polyps that are found. Most polyps can be removed during a colonoscopy. Those polyps will then be tested for cancer.  Additional treatment may be needed depending on the results of testing. Follow these instructions at home: Lifestyle  Maintain a healthy weight, or lose weight if recommended by your health care provider.  Exercise every day or as told by your health care provider.  Do not use any products that contain nicotine or tobacco, such as cigarettes and e-cigarettes. If you need help quitting, ask your health care provider.  If you drink alcohol, limit how much you have: ? 0-1 drink a day for women. ? 0-2 drinks a day for men.  Be aware of how much alcohol is in your drink. In the [place] one drink equals one 12 oz bottle of beer (355 mL), one 5 oz glass of wine (148 mL), or one 1 oz shot of hard liquor (44 mL). Eating and drinking   Eat foods that are high in fiber, such as fruits, vegetables, and whole grains.  Eat foods that are high in calcium and vitamin D, such as milk, cheese, yogurt, eggs, liver, fish, and broccoli.  Limit foods that are high in fat, such as fried foods and desserts.  Limit the amount of red meat and processed meat you eat, such as hot dogs, sausage, bacon, and lunch meats. General instructions  Keep all follow-up visits as told by your health care provider. This is important. ? This includes having regularly scheduled colonoscopies. ? Talk to your health care provider about when you need a colonoscopy. Contact a health care provider if:  You have new or worsening bleeding during a bowel movement.  You have new or increased blood in your stool.  You have a change in bowel habits.  You lose weight for no known reason. Summary  Polyps are tissue growths inside the body. Polyps can grow in many places, including the colon.  Most colon polyps are noncancerous (benign), but some can become cancerous over time.  This condition is diagnosed with a colonoscopy.  Treatment for this condition involves removing any polyps that are found. Most polyps can be removed  during a colonoscopy. This information is not intended to replace advice given to you by your health care provider. Make sure you discuss any questions you have with your health care provider. Document Released: 07/13/2004 Document Revised: 02/01/2018 Document Reviewed: 02/01/2018 Elsevier Interactive Patient Education  2019 [place].  Colonoscopy, Adult, Care After This sheet gives you information about how to care for yourself after your procedure. Your health care provider may also give you more specific instructions. If you have problems or questions, contact your health care provider. What can I expect after the procedure? After the procedure, it is common to have:  A small amount of blood in your stool for 24 hours after the procedure.  Some gas.  Mild abdominal cramping or bloating. Follow these instructions at home: General instructions  For the first 24 hours after the procedure: ? Do not drive or use machinery. ? Do not sign important documents. ? Do not drink alcohol. ? Do your regular daily activities at a slower pace than normal. ? Eat soft, easy-to-digest foods.  Take over-the-counter or prescription medicines only as told by your health care provider. Relieving cramping and bloating   Try walking around when you have cramps or feel bloated.  Apply heat to your abdomen as told by your health care provider. Use a heat source that your health care provider recommends, such as a moist heat pack or a heating pad. ? Place a towel between your skin and the heat source. ? Leave the heat on for 20-30 minutes. ? Remove the heat if your skin turns bright red. This is especially important if you are unable to feel pain, heat, or cold. You may have a greater risk of getting burned. Eating and drinking   Drink enough fluid to keep your urine pale yellow.  Resume your normal diet as instructed by your health care provider. Avoid heavy or fried foods that are hard to  digest.  Avoid drinking alcohol for as long as instructed by your health care provider. Contact a health care provider if:  You have blood in your stool 2-3 days after the procedure. Get help right away if:  You have more than a small spotting of blood in your stool.  You pass large blood clots in your stool.  Your abdomen is swollen.  You have nausea or vomiting.  You have a fever.  You have increasing abdominal pain that is not relieved with medicine. Summary  After the procedure, it is common to have a small amount of blood in your stool. You may also have mild abdominal cramping and bloating.  For the first 24 hours after the procedure, do not drive or use machinery, sign important documents, or drink alcohol.  Contact your health care provider if you have a lot of blood in your stool, nausea or vomiting, a fever, or increased abdominal pain. This information is not intended to replace advice given to you by your health care provider. Make sure you discuss any questions you have with  your health care provider. Document Released: 05/31/2004 Document Revised: 08/09/2017 Document Reviewed: 12/29/2015 Elsevier Interactive Patient Education  2019 [place]. No aspirin or NSAIDs for 24 hours. Resume usual medications as before. High-fiber diet. No driving for 24 hours. Physician will call with biopsy results.

## 2019-04-18 DIAGNOSIS — Z800 Family history of malignant neoplasm of digestive organs: Secondary | ICD-10-CM

## 2019-04-18 DIAGNOSIS — Z86010 Personal history of colonic polyps: Principal | ICD-10-CM

## 2019-04-19 ENCOUNTER — Encounter (HOSPITAL_COMMUNITY): Payer: Self-pay | Admitting: Internal Medicine

## 2019-04-29 DIAGNOSIS — I8311 Varicose veins of right lower extremity with inflammation: Secondary | ICD-10-CM | POA: Diagnosis not present

## 2019-04-29 DIAGNOSIS — I83811 Varicose veins of right lower extremities with pain: Secondary | ICD-10-CM | POA: Diagnosis not present

## 2019-05-28 ENCOUNTER — Other Ambulatory Visit (HOSPITAL_COMMUNITY)
Admission: RE | Admit: 2019-05-28 | Discharge: 2019-05-28 | Disposition: A | Discharge status: Home / Self Care | Payer: 59 | Source: Ambulatory Visit | Attending: Adult Health | Admitting: Adult Health

## 2019-05-28 ENCOUNTER — Other Ambulatory Visit: Payer: Self-pay

## 2019-05-28 ENCOUNTER — Ambulatory Visit (INDEPENDENT_AMBULATORY_CARE_PROVIDER_SITE_OTHER): Payer: 59 | Admitting: Adult Health

## 2019-05-28 ENCOUNTER — Encounter: Payer: Self-pay | Admitting: Adult Health

## 2019-05-28 VITALS — BP 125/78 | HR 60 | Ht 62.25 in | Wt 190.5 lb

## 2019-05-28 DIAGNOSIS — Z131 Encounter for screening for diabetes mellitus: Secondary | ICD-10-CM

## 2019-05-28 DIAGNOSIS — Z113 Encounter for screening for infections with a predominantly sexual mode of transmission: Secondary | ICD-10-CM

## 2019-05-28 DIAGNOSIS — Z01419 Encounter for gynecological examination (general) (routine) without abnormal findings: Secondary | ICD-10-CM | POA: Insufficient documentation

## 2019-05-28 DIAGNOSIS — Z975 Presence of (intrauterine) contraceptive device: Secondary | ICD-10-CM | POA: Insufficient documentation

## 2019-05-28 DIAGNOSIS — Z1321 Encounter for screening for nutritional disorder: Secondary | ICD-10-CM

## 2019-05-28 NOTE — Addendum Note (Signed)
Addended by: Linton Rump on: 05/28/2019 04:28 PM   Modules accepted: Orders

## 2019-05-28 NOTE — Progress Notes (Signed)
Patient ID: Miranda Williams, female   DOB: 1971/04/30, 48 y.o.   MRN: [id number] History of Present Illness: Miranda Williams is a 48 year old white female, divorced, G1P1 in for a well woman gyn exam and pap, her pap was +HPV 01/31/18. She had colonoscopy in June had 2 polyps removed by Dr Laural Golden, and gets one every 5 years. She has a Liletta IUD that was inserted 12/31/14.  And had mammogram 03/11/19, that was negative.  PCP is Miranda Williams.   Current Medications, Allergies, Past Medical History, Past Surgical History, Family History and Social History were reviewed in [employer] record.     Review of Systems: Patient denies any headaches, hearing loss, fatigue, blurred vision, shortness of breath, chest pain, abdominal pain, problems with bowel movements, urination, or intercourse. No joint pain or mood swings. She says weight is up and now.     Physical Exam:BP 125/78 (BP Location: Left Arm, Patient Position: Sitting, Cuff Size: Normal)   Pulse 60   Ht 5' 2.25" (1.581 m)   Wt 190 lb 8 oz (86.4 kg)   BMI 34.56 kg/m  General:  Well developed, well nourished, no acute distress Skin:  Warm and dry Neck:  Midline trachea, normal thyroid, good ROM, no lymphadenopathy Lungs; Clear to auscultation bilaterally Breast:  No dominant palpable mass, retraction, or nipple discharge Cardiovascular: Regular rate and rhythm Abdomen:  Soft, non tender, no hepatosplenomegaly Pelvic:  External genitalia is normal in appearance, no lesions.  The vagina is normal in appearance. Urethra has no lesions or masses. The cervix is bulbous, and smooth, +IUD strings at os, pap with HPV with 16/18 genotyping reflex.Marland Kitchen  Uterus is felt to be normal size, shape, and contour.  No adnexal masses or tenderness noted.Bladder is non tender, no masses felt. Rectal: deferred. Extremities/musculoskeletal:  No swelling or varicosities noted, no clubbing or cyanosis Psych:  No mood changes, alert and  cooperative,seems happy Fall risk is low PHQ 2 score 0.  Examination chaperoned by Miranda Pupa LPN.  She requests fasting labs.   Impression: 1. Encounter for gynecological examination with Papanicolaou smear of cervix   2. IUD (intrauterine device) in place   3. Screening for diabetes mellitus   4. Encounter for vitamin deficiency screening   5. Screening examination for STD (sexually transmitted disease)       Plan: Check CBC,CMP,TSH and lipids,A1c, vitamin D and HIV Physical in 1 year Pap in 3 if normal Mammogram yearly Colonoscopy 2025

## 2019-05-30 ENCOUNTER — Other Ambulatory Visit (HOSPITAL_COMMUNITY)
Admission: RE | Admit: 2019-05-30 | Discharge: 2019-05-30 | Disposition: A | Discharge status: Home / Self Care | Payer: 59 | Source: Other Acute Inpatient Hospital | Attending: Adult Health | Admitting: Adult Health

## 2019-05-30 DIAGNOSIS — Z01419 Encounter for gynecological examination (general) (routine) without abnormal findings: Secondary | ICD-10-CM | POA: Diagnosis not present

## 2019-05-30 DIAGNOSIS — Z113 Encounter for screening for infections with a predominantly sexual mode of transmission: Secondary | ICD-10-CM | POA: Insufficient documentation

## 2019-05-30 DIAGNOSIS — Z1321 Encounter for screening for nutritional disorder: Secondary | ICD-10-CM | POA: Insufficient documentation

## 2019-05-30 LAB — COMPREHENSIVE METABOLIC PANEL
ALT: 21 U/L (ref 0–44)
AST: 17 U/L (ref 15–41)
Albumin: 4.5 g/dL (ref 3.5–5.0)
Alkaline Phosphatase: 71 U/L (ref 38–126)
Anion gap: 8 (ref 5–15)
BUN: 11 mg/dL (ref 6–20)
CO2: 25 mmol/L (ref 22–32)
Calcium: 9.2 mg/dL (ref 8.9–10.3)
Chloride: 105 mmol/L (ref 98–111)
Creatinine, Ser: 0.74 mg/dL (ref 0.44–1.00)
GFR calc Af Amer: >60 mL/min (ref >60)
GFR calc non Af Amer: >60 mL/min (ref >60)
Glucose, Bld: 101 mg/dL — ABNORMAL HIGH (ref 70–99)
Potassium: 4.1 mmol/L (ref 3.5–5.1)
Sodium: 138 mmol/L (ref 135–145)
Total Bilirubin: 0.9 mg/dL (ref 0.3–1.2)
Total Protein: 7.8 g/dL (ref 6.5–8.1)

## 2019-05-30 LAB — TSH: TSH: 1.765 u[IU]/mL (ref 0.350–4.500)

## 2019-05-30 LAB — CBC
HCT: 43 % (ref 36.0–46.0)
Hemoglobin: 13.8 g/dL (ref 12.0–15.0)
MCH: 28.6 pg (ref 26.0–34.0)
MCHC: 32.1 g/dL (ref 30.0–36.0)
MCV: 89 fL (ref 80.0–100.0)
Platelets: 296 10*3/uL (ref 150–400)
RBC: 4.83 MIL/uL (ref 3.87–5.11)
RDW: 13.1 % (ref 11.5–15.5)
WBC: 5.9 10*3/uL (ref 4.0–10.5)
nRBC: 0 % (ref 0.0–0.2)

## 2019-05-30 LAB — LIPID PANEL
Cholesterol: 279 mg/dL — ABNORMAL HIGH (ref 0–200)
HDL: 49 mg/dL (ref >40)
LDL Cholesterol: 216 mg/dL — ABNORMAL HIGH (ref 0–99)
Total CHOL/HDL Ratio: 5.7 RATIO
Triglycerides: 71 mg/dL (ref <150)
VLDL: 14 mg/dL (ref 0–40)

## 2019-05-30 LAB — HEMOGLOBIN A1C
Hgb A1c MFr Bld: 5.9 % — ABNORMAL HIGH (ref 4.8–5.6)
Mean Plasma Glucose: 122.63 mg/dL

## 2019-05-31 LAB — HIV ANTIBODY (ROUTINE TESTING W REFLEX): HIV Screen 4th Generation wRfx: NONREACTIVE

## 2019-06-01 LAB — CYTOLOGY - PAP
Adequacy: ABSENT
Diagnosis: NEGATIVE
HPV 16/18/45 genotyping: NEGATIVE
HPV: DETECTED — AB

## 2019-06-01 LAB — VITAMIN D 25 HYDROXY (VIT D DEFICIENCY, FRACTURES): Vit D, 25-Hydroxy: 27.1 ng/mL — ABNORMAL LOW (ref 30.0–100.0)

## 2019-06-03 ENCOUNTER — Telehealth: Payer: Self-pay | Admitting: Adult Health

## 2019-06-03 NOTE — Telephone Encounter (Signed)
Left message that pap negative for malignancy but +HPV, needs colposcopy (per ASCCP 2019 guidelines)

## 2019-06-05 DIAGNOSIS — R739 Hyperglycemia, unspecified: Secondary | ICD-10-CM | POA: Diagnosis not present

## 2019-06-05 DIAGNOSIS — E782 Mixed hyperlipidemia: Secondary | ICD-10-CM | POA: Diagnosis not present

## 2019-06-05 DIAGNOSIS — R4681 Obsessive-compulsive behavior: Secondary | ICD-10-CM | POA: Diagnosis not present

## 2019-06-05 DIAGNOSIS — Z6834 Body mass index (BMI) 34.0-34.9, adult: Secondary | ICD-10-CM | POA: Diagnosis not present

## 2019-06-05 MED FILL — SERTRALINE HCL 100 MG TAB: 100 | 90 days supply | Qty: 135 | Fill #0

## 2019-06-05 MED FILL — ROSUVASTATIN CALCIUM 5 MG T: 5 | 30 days supply | Qty: 30 | Fill #0

## 2019-06-11 DIAGNOSIS — M79672 Pain in left foot: Secondary | ICD-10-CM | POA: Diagnosis not present

## 2019-06-11 DIAGNOSIS — M79671 Pain in right foot: Secondary | ICD-10-CM | POA: Diagnosis not present

## 2019-06-11 DIAGNOSIS — M7742 Metatarsalgia, left foot: Secondary | ICD-10-CM | POA: Diagnosis not present

## 2019-06-11 DIAGNOSIS — M7741 Metatarsalgia, right foot: Secondary | ICD-10-CM | POA: Diagnosis not present

## 2019-06-11 DIAGNOSIS — M25872 Other specified joint disorders, left ankle and foot: Secondary | ICD-10-CM | POA: Diagnosis not present

## 2019-06-11 DIAGNOSIS — M25871 Other specified joint disorders, right ankle and foot: Secondary | ICD-10-CM | POA: Diagnosis not present

## 2019-06-11 MED FILL — MELOXICAM 15 MG TABLET: 15 | 30 days supply | Qty: 30 | Fill #0

## 2019-06-18 ENCOUNTER — Encounter: Payer: 59 | Admitting: Obstetrics & Gynecology

## 2019-07-01 ENCOUNTER — Encounter: Payer: 59 | Admitting: Obstetrics & Gynecology

## 2019-07-02 MED FILL — ROSUVASTATIN CALCIUM 5 MG T: 5 | 30 days supply | Qty: 30 | Fill #1

## 2019-08-01 DIAGNOSIS — F411 Generalized anxiety disorder: Secondary | ICD-10-CM | POA: Diagnosis not present

## 2019-08-01 DIAGNOSIS — Z6834 Body mass index (BMI) 34.0-34.9, adult: Secondary | ICD-10-CM | POA: Diagnosis not present

## 2019-08-01 DIAGNOSIS — R4681 Obsessive-compulsive behavior: Secondary | ICD-10-CM | POA: Diagnosis not present

## 2019-08-01 MED FILL — ESCITALOPRAM 10 MG TABLET: 10 | 30 days supply | Qty: 30 | Fill #0

## 2019-08-01 MED FILL — clonazePAM 0.5 MG TABS: 0.5 | 10 days supply | Qty: 10 | Fill #0

## 2019-08-01 MED FILL — ROSUVASTATIN CALCIUM 5 MG T: 5 | 30 days supply | Qty: 30 | Fill #2

## 2019-08-09 DIAGNOSIS — I83893 Varicose veins of bilateral lower extremities with other complications: Secondary | ICD-10-CM | POA: Diagnosis not present

## 2019-08-09 DIAGNOSIS — I83813 Varicose veins of bilateral lower extremities with pain: Secondary | ICD-10-CM | POA: Diagnosis not present

## 2019-09-02 MED FILL — ESCITALOPRAM 10 MG TABLET: 10 | 30 days supply | Qty: 30 | Fill #0

## 2019-09-02 MED FILL — ROSUVASTATIN CALCIUM 5 MG T: 5 | 30 days supply | Qty: 30 | Fill #0

## 2019-09-07 DIAGNOSIS — E782 Mixed hyperlipidemia: Secondary | ICD-10-CM | POA: Diagnosis not present

## 2019-09-07 DIAGNOSIS — F411 Generalized anxiety disorder: Secondary | ICD-10-CM | POA: Diagnosis not present

## 2019-09-07 DIAGNOSIS — R739 Hyperglycemia, unspecified: Secondary | ICD-10-CM | POA: Diagnosis not present

## 2019-09-10 DIAGNOSIS — Z6834 Body mass index (BMI) 34.0-34.9, adult: Secondary | ICD-10-CM | POA: Diagnosis not present

## 2019-09-10 DIAGNOSIS — F411 Generalized anxiety disorder: Secondary | ICD-10-CM | POA: Diagnosis not present

## 2019-09-10 DIAGNOSIS — E782 Mixed hyperlipidemia: Secondary | ICD-10-CM | POA: Diagnosis not present

## 2019-09-10 MED FILL — clonazePAM 0.5 MG TABS: 0.5 | 30 days supply | Qty: 30 | Fill #0

## 2019-09-30 MED FILL — ROSUVASTATIN CALCIUM 5 MG T: 5 | 90 days supply | Qty: 90 | Fill #0

## 2019-09-30 MED FILL — ESCITALOPRAM 10 MG TABLET: 10 | 30 days supply | Qty: 30 | Fill #0

## 2019-10-15 DIAGNOSIS — L649 Androgenic alopecia, unspecified: Secondary | ICD-10-CM | POA: Diagnosis not present

## 2019-10-15 DIAGNOSIS — D225 Melanocytic nevi of trunk: Secondary | ICD-10-CM | POA: Diagnosis not present

## 2019-10-15 DIAGNOSIS — D2271 Melanocytic nevi of right lower limb, including hip: Secondary | ICD-10-CM | POA: Diagnosis not present

## 2019-10-15 DIAGNOSIS — L821 Other seborrheic keratosis: Secondary | ICD-10-CM | POA: Diagnosis not present

## 2019-10-15 DIAGNOSIS — L573 Poikiloderma of Civatte: Secondary | ICD-10-CM | POA: Diagnosis not present

## 2019-10-21 ENCOUNTER — Telehealth: Payer: 59 | Admitting: Family

## 2019-10-21 DIAGNOSIS — R42 Dizziness and giddiness: Secondary | ICD-10-CM

## 2019-10-21 NOTE — Progress Notes (Signed)
Based on what you shared with me, I feel your condition warrants further evaluation and I recommend that you be seen for a face to face visit.  Please contact your primary care physician practice to be seen. Many offices offer virtual options to be seen via video if you are not comfortable going in person to a medical facility at this time.  WE need to determine the source of your dizziness. It is probably minor but could be more serious.   If you do not have a PCP, [facility] offers a free physician referral service available at [phone]. Our trained staff has the experience, knowledge and resources to put you in touch with a physician who is right for you.   You also have the option of a video visit through [email or web address]  If you are having a true medical emergency please call 911.  NOTE: If you entered your credit card information for this eVisit, you will not be charged. You may see a "hold" on your card for the $35 but that hold will drop off and you will not have a charge processed.  Your e-visit answers were reviewed by a board certified advanced clinical practitioner to complete your personal care plan.  Thank you for using e-Visits.

## 2019-10-24 MED FILL — ESCITALOPRAM 10 MG TABLET: 10 | 30 days supply | Qty: 30 | Fill #1

## 2019-11-08 IMAGING — MG MM DIGITAL DIAGNOSTIC BILAT W/ TOMO W/ CAD
6 of 9 series · 6 of 25 positions shown · non-contrast
Comparison: Previous exam(s).

CLINICAL DATA: Follow-up for probably benign right breast
asymmetry.

EXAM:
DIGITAL DIAGNOSTIC BILATERAL MAMMOGRAM WITH CAD AND TOMO

[R CC (1 of 2)]
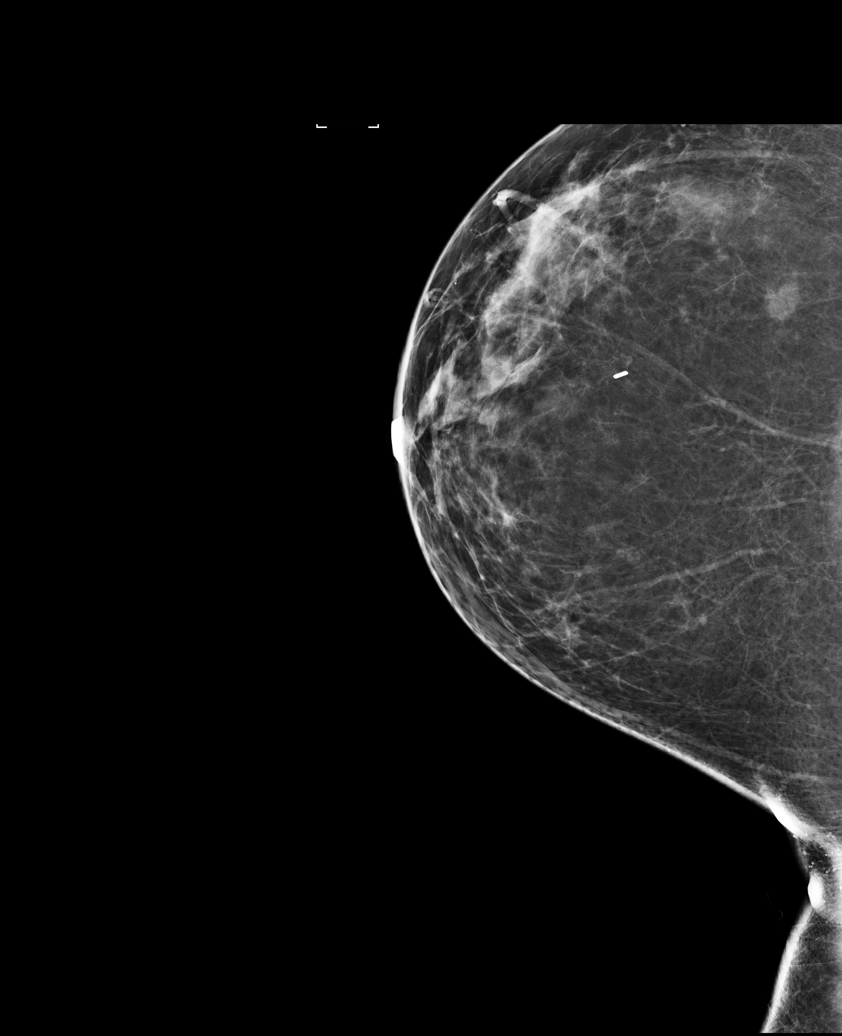

[R MLO]
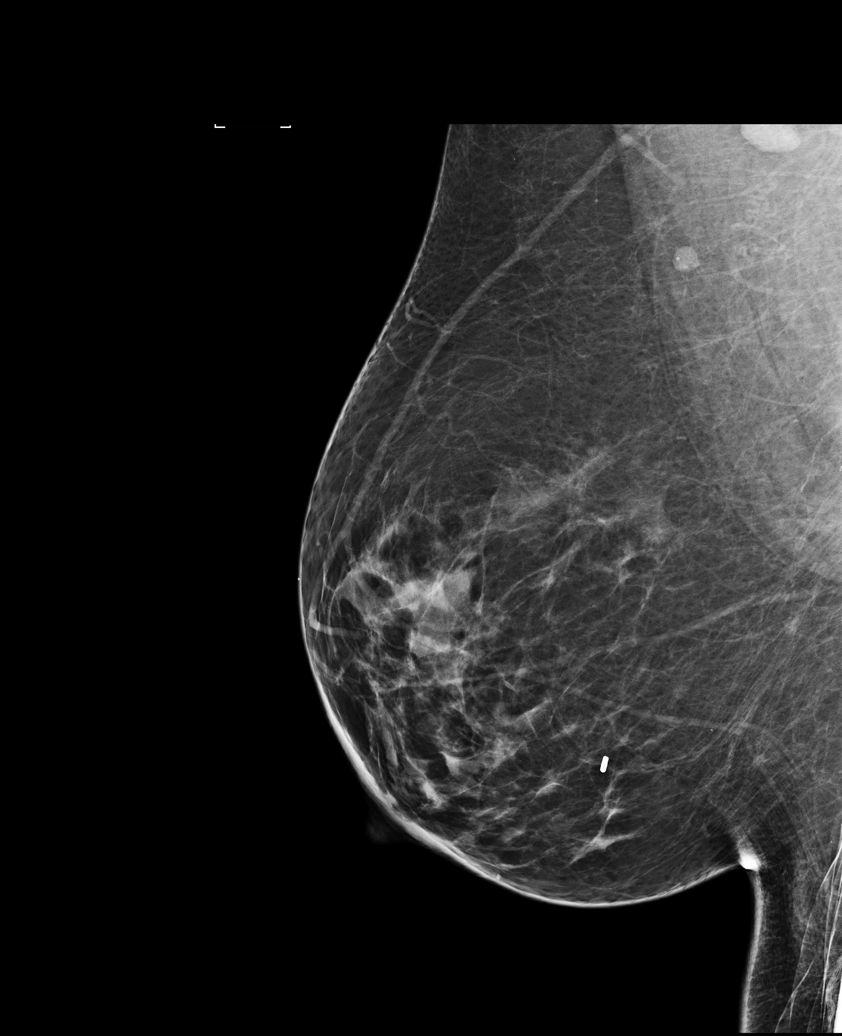

[L CC]
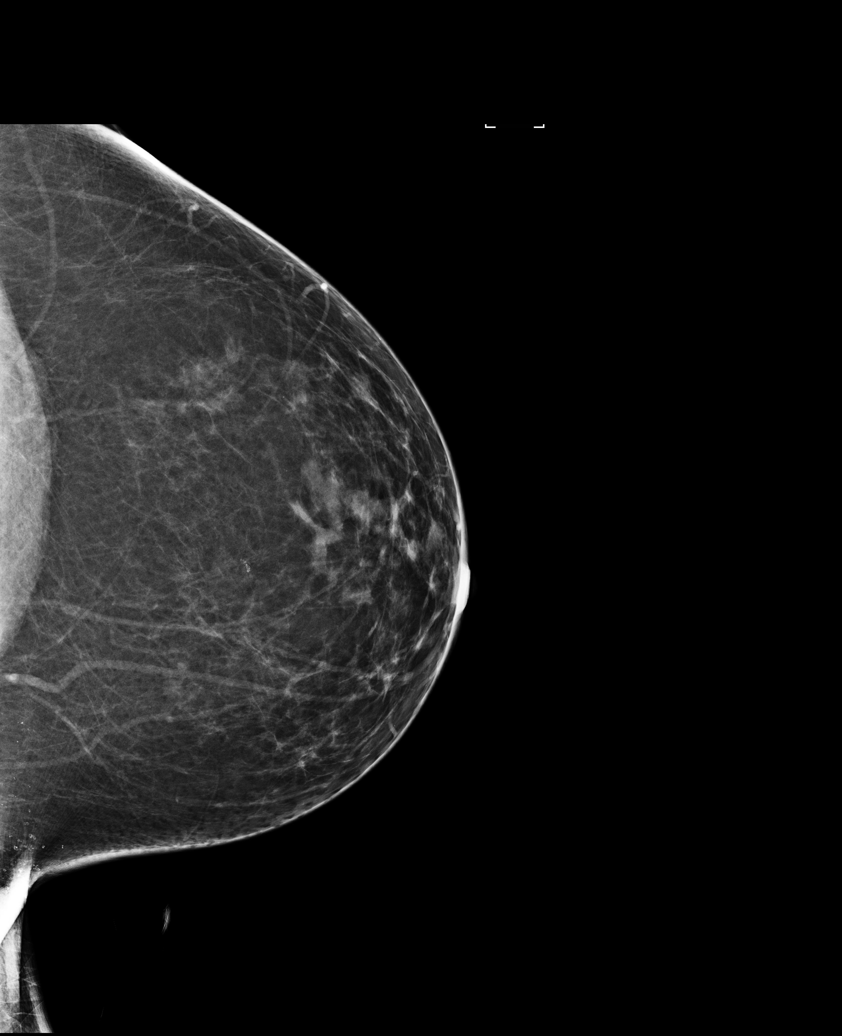

[R CC (2 of 2)]
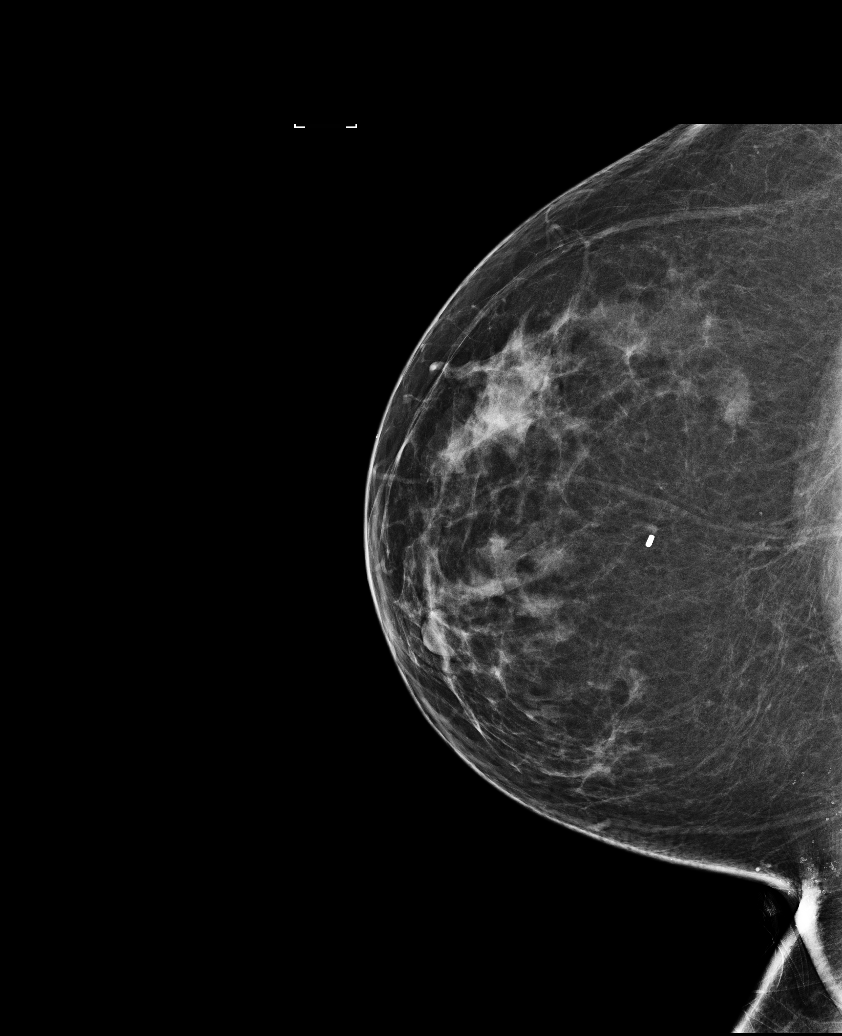

[L MLO]
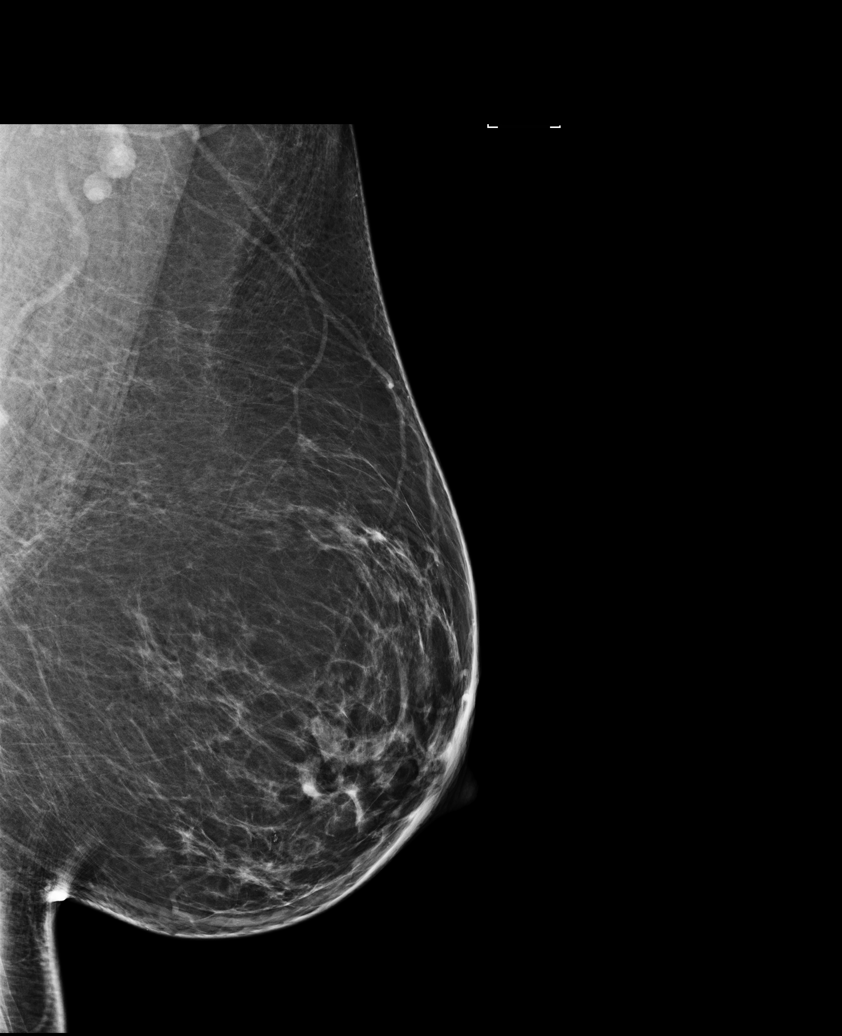

[R CC tomo · tomo slice 41/80.0]
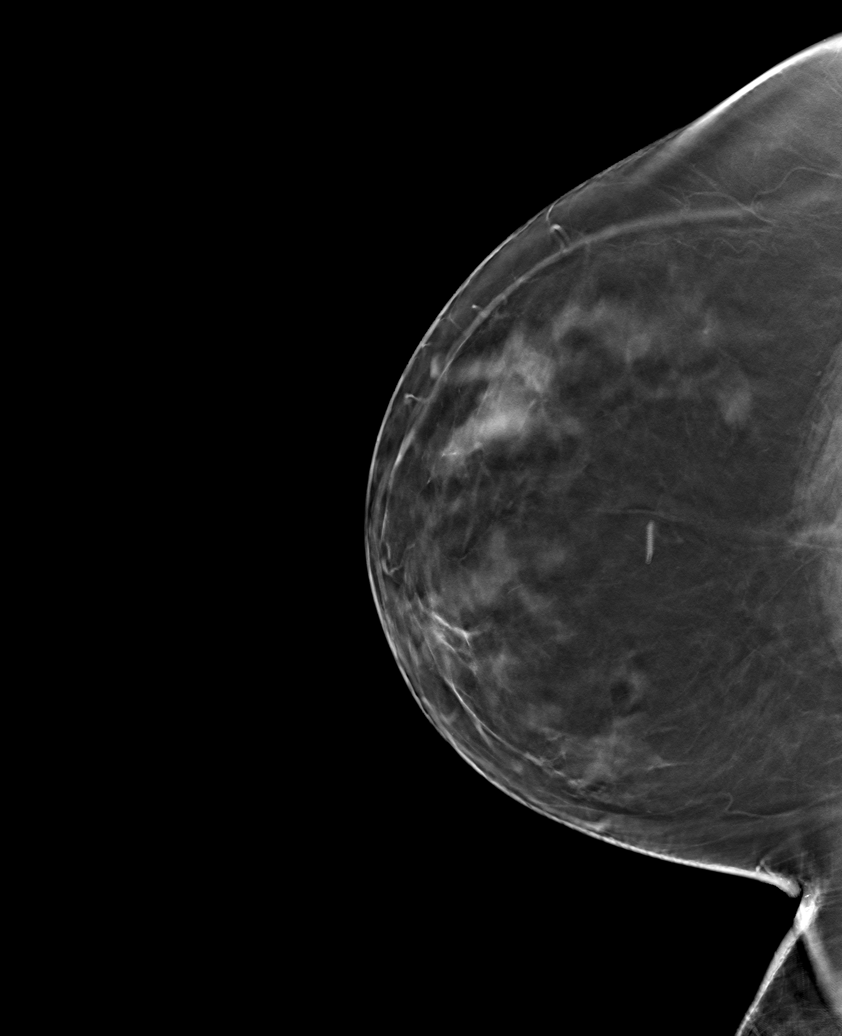

[6 of 25 positions shown; findings below may reference images not displayed]

ACR Breast Density Category b: There are scattered areas of
fibroglandular density.
FINDINGS: No suspicious masses or calcifications are seen in either breast.
The benign appearing focal asymmetry in the upper-outer posterior
right breast is stable dating back to at least 1923 and therefore
considered benign. There is no mammographic evidence of malignancy
in either breast.

Mammographic images were processed with CAD.
IMPRESSION: No mammographic evidence of malignancy in either breast.

RECOMMENDATION:
Screening mammogram in one year.(Code:[id number])

I have discussed the findings and recommendations with the patient.
Results were also provided in writing at the conclusion of the
visit. If applicable, a reminder letter will be sent to the patient
regarding the next appointment.

BI-RADS CATEGORY  1: Negative.

## 2019-11-26 MED FILL — ESCITALOPRAM 10 MG TABLET: 10 | 30 days supply | Qty: 30 | Fill #0

## 2019-12-12 DIAGNOSIS — F411 Generalized anxiety disorder: Secondary | ICD-10-CM | POA: Diagnosis not present

## 2019-12-12 DIAGNOSIS — Z6836 Body mass index (BMI) 36.0-36.9, adult: Secondary | ICD-10-CM | POA: Diagnosis not present

## 2019-12-12 DIAGNOSIS — E6609 Other obesity due to excess calories: Secondary | ICD-10-CM | POA: Diagnosis not present

## 2019-12-12 DIAGNOSIS — R4681 Obsessive-compulsive behavior: Secondary | ICD-10-CM | POA: Diagnosis not present

## 2019-12-12 MED FILL — ESCITALOPRAM 20 MG TABLET: 20 | 90 days supply | Qty: 90 | Fill #0

## 2019-12-24 MED FILL — ROSUVASTATIN CALCIUM 5 MG T: 5 | 90 days supply | Qty: 90 | Fill #1

## 2020-02-04 ENCOUNTER — Other Ambulatory Visit (HOSPITAL_COMMUNITY): Payer: Self-pay | Admitting: Advanced Practice Midwife

## 2020-02-04 DIAGNOSIS — Z1231 Encounter for screening mammogram for malignant neoplasm of breast: Secondary | ICD-10-CM

## 2020-03-06 DIAGNOSIS — F411 Generalized anxiety disorder: Secondary | ICD-10-CM | POA: Diagnosis not present

## 2020-03-06 DIAGNOSIS — Z6837 Body mass index (BMI) 37.0-37.9, adult: Secondary | ICD-10-CM | POA: Diagnosis not present

## 2020-03-06 DIAGNOSIS — E782 Mixed hyperlipidemia: Secondary | ICD-10-CM | POA: Diagnosis not present

## 2020-03-06 DIAGNOSIS — Z7689 Persons encountering health services in other specified circumstances: Secondary | ICD-10-CM | POA: Diagnosis not present

## 2020-03-06 DIAGNOSIS — R739 Hyperglycemia, unspecified: Secondary | ICD-10-CM | POA: Diagnosis not present

## 2020-03-06 DIAGNOSIS — R350 Frequency of micturition: Secondary | ICD-10-CM | POA: Diagnosis not present

## 2020-03-16 ENCOUNTER — Ambulatory Visit (HOSPITAL_COMMUNITY)
Admission: RE | Admit: 2020-03-16 | Discharge: 2020-03-16 | Disposition: A | Discharge status: Home / Self Care | Payer: 59 | Source: Ambulatory Visit | Attending: Advanced Practice Midwife | Admitting: Advanced Practice Midwife

## 2020-03-16 ENCOUNTER — Other Ambulatory Visit: Payer: Self-pay

## 2020-03-16 DIAGNOSIS — Z1231 Encounter for screening mammogram for malignant neoplasm of breast: Secondary | ICD-10-CM | POA: Diagnosis not present

## 2020-03-26 MED FILL — ROSUVASTATIN CALCIUM 5 MG T: 5 | 30 days supply | Qty: 30 | Fill #1

## 2020-03-26 MED FILL — ESCITALOPRAM 20 MG TABLET: 20 | 90 days supply | Qty: 90 | Fill #0

## 2020-04-27 MED FILL — ROSUVASTATIN CALCIUM 5 MG T: 5 | 30 days supply | Qty: 30 | Fill #0

## 2020-05-15 DIAGNOSIS — H5203 Hypermetropia, bilateral: Secondary | ICD-10-CM | POA: Diagnosis not present

## 2020-05-23 MED FILL — ROSUVASTATIN CALCIUM 5 MG T: 5 | 30 days supply | Qty: 30 | Fill #1

## 2020-06-01 ENCOUNTER — Other Ambulatory Visit (HOSPITAL_COMMUNITY)
Admission: RE | Admit: 2020-06-01 | Discharge: 2020-06-01 | Disposition: A | Discharge status: Home / Self Care | Payer: 59 | Source: Ambulatory Visit | Attending: Adult Health | Admitting: Adult Health

## 2020-06-01 ENCOUNTER — Ambulatory Visit (INDEPENDENT_AMBULATORY_CARE_PROVIDER_SITE_OTHER): Payer: 59 | Admitting: Adult Health

## 2020-06-01 ENCOUNTER — Encounter: Payer: Self-pay | Admitting: Adult Health

## 2020-06-01 VITALS — BP 120/65 | HR 66 | Ht 62.0 in | Wt 207.0 lb

## 2020-06-01 DIAGNOSIS — Z975 Presence of (intrauterine) contraceptive device: Secondary | ICD-10-CM

## 2020-06-01 DIAGNOSIS — Z01419 Encounter for gynecological examination (general) (routine) without abnormal findings: Secondary | ICD-10-CM

## 2020-06-01 DIAGNOSIS — Z1211 Encounter for screening for malignant neoplasm of colon: Secondary | ICD-10-CM

## 2020-06-01 LAB — HEMOCCULT GUIAC POC 1CARD (OFFICE): Fecal Occult Blood, POC: NEGATIVE

## 2020-06-01 NOTE — Progress Notes (Signed)
Patient ID: Miranda Williams, female   DOB: 1971/07/13, 49 y.o.   MRN: [id number] History of Present Illness: Miranda Williams is a 49 year old white female,divorced, G1P1 in for a well woman gyn exam and pap.She wants the pap, even though had normal with with negative HPV 05/28/2019.  She has had COVID vaccine and had COVID 5 days after first injection.She works for [employer].  PCP is Denny Levy PA.   Current Medications, Allergies, Past Medical History, Past Surgical History, Family History and Social History were reviewed in [employer] record.     Review of Systems: Patient denies any headaches, hearing loss, fatigue, blurred vision, shortness of breath, chest pain, abdominal pain, problems with bowel movements, urination, or intercourse. No joint pain or mood swings. No periods with IUD  Physical Exam:BP 120/65 (BP Location: Left Arm, Patient Position: Sitting, Cuff Size: Normal)   Pulse 66   Ht 5\' 2"  (1.575 m)   Wt 207 lb (93.9 kg)   BMI 37.86 kg/m  General:  Well developed, well nourished, no acute distress Skin:  Warm and dry Neck:  Midline trachea, normal thyroid, good ROM, no lymphadenopathy Lungs; Clear to auscultation bilaterally Breast:  No dominant palpable mass, retraction, or nipple discharge Cardiovascular: Regular rate and rhythm Abdomen:  Soft, non tender, no hepatosplenomegaly Pelvic:  External genitalia is normal in appearance, no lesions.  The vagina is normal in appearance. Urethra has no lesions or masses. The cervix is bulbous.+IUD strings at os, pap with high risk HPV 16/18 genotyping performed,cervix was friable with EC brush.   Uterus is felt to be normal size, shape, and contour.  No adnexal masses or tenderness noted.Bladder is non tender, no masses felt. Rectal: Good sphincter tone, no polyps, or hemorrhoids felt.  Hemoccult negative. Extremities/musculoskeletal:  No swelling or varicosities noted, no clubbing or cyanosis Psych:  No mood  changes, alert and cooperative,seems happy AA is 1 Fall risk is low PHQ 9 score is 7, no SI on Zoloft.  [facility] - 06/01/20 1541      Pregnancy Intention Screening   Does the patient want to become pregnant in the next year? N/A    Does the patient's partner want to become pregnant in the next year? N/A    Would the patient like to discuss contraceptive options today? N/A      Contraception Wrap Up   Current Method IUD or IUS    End Method IUD or IUS    Contraception Counseling Provided No         Examination chaperoned by Dwyane Dee LPN  Impression and Plan: 1. Encounter for gynecological examination with Papanicolaou smear of cervix Pap sent Physical in 1 year  Pap in 3 if normal Mammogram yearly Labs with PCP Colonoscopy 2025  2. Encounter for screening fecal occult blood testing   3. IUD (intrauterine device) in place Placed 12/31/2014.

## 2020-06-04 LAB — CYTOLOGY - PAP
Comment: NEGATIVE
Diagnosis: NEGATIVE
High risk HPV: NEGATIVE

## 2020-06-13 DIAGNOSIS — F411 Generalized anxiety disorder: Secondary | ICD-10-CM | POA: Diagnosis not present

## 2020-06-13 DIAGNOSIS — Z0000 Encounter for general adult medical examination without abnormal findings: Secondary | ICD-10-CM | POA: Diagnosis not present

## 2020-06-13 DIAGNOSIS — E782 Mixed hyperlipidemia: Secondary | ICD-10-CM | POA: Diagnosis not present

## 2020-06-13 DIAGNOSIS — R739 Hyperglycemia, unspecified: Secondary | ICD-10-CM | POA: Diagnosis not present

## 2020-06-15 DIAGNOSIS — E782 Mixed hyperlipidemia: Secondary | ICD-10-CM | POA: Diagnosis not present

## 2020-06-15 DIAGNOSIS — Z6837 Body mass index (BMI) 37.0-37.9, adult: Secondary | ICD-10-CM | POA: Diagnosis not present

## 2020-06-15 DIAGNOSIS — F411 Generalized anxiety disorder: Secondary | ICD-10-CM | POA: Diagnosis not present

## 2020-06-15 DIAGNOSIS — Z0000 Encounter for general adult medical examination without abnormal findings: Secondary | ICD-10-CM | POA: Diagnosis not present

## 2020-06-15 DIAGNOSIS — R739 Hyperglycemia, unspecified: Secondary | ICD-10-CM | POA: Diagnosis not present

## 2020-06-15 MED FILL — OMEPRAZOLE 40 MG CPDR: 40 | 90 days supply | Qty: 90 | Fill #0

## 2020-06-15 MED FILL — ESCITALOPRAM 20 MG TABLET: 20 | 90 days supply | Qty: 90 | Fill #0

## 2020-06-16 MED FILL — ROSUVASTATIN CALCIUM 5 MG T: 5 | 90 days supply | Qty: 90 | Fill #0

## 2020-08-10 DIAGNOSIS — I8312 Varicose veins of left lower extremity with inflammation: Secondary | ICD-10-CM | POA: Diagnosis not present

## 2020-08-10 DIAGNOSIS — I83812 Varicose veins of left lower extremities with pain: Secondary | ICD-10-CM | POA: Diagnosis not present

## 2020-09-15 ENCOUNTER — Other Ambulatory Visit (HOSPITAL_COMMUNITY): Payer: Self-pay | Admitting: Family Medicine

## 2020-09-15 DIAGNOSIS — E782 Mixed hyperlipidemia: Secondary | ICD-10-CM | POA: Diagnosis not present

## 2020-09-15 DIAGNOSIS — R739 Hyperglycemia, unspecified: Secondary | ICD-10-CM | POA: Diagnosis not present

## 2020-09-15 DIAGNOSIS — F411 Generalized anxiety disorder: Secondary | ICD-10-CM | POA: Diagnosis not present

## 2020-09-15 DIAGNOSIS — Z6837 Body mass index (BMI) 37.0-37.9, adult: Secondary | ICD-10-CM | POA: Diagnosis not present

## 2020-09-15 MED FILL — OMEPRAZOLE 40 MG CPDR: 40 | 90 days supply | Qty: 90 | Fill #0

## 2020-09-15 MED FILL — ROSUVASTATIN CALCIUM 5 MG T: 5 | 90 days supply | Qty: 90 | Fill #0

## 2020-09-15 MED FILL — VENLAFAXINE HCL ER 150 MG C: 150 | 90 days supply | Qty: 90 | Fill #0

## 2020-09-15 MED FILL — FLUTICASONE PROP 50 MCG SPR: 50 | 90 days supply | Qty: 48 | Fill #0

## 2020-09-22 DIAGNOSIS — I8311 Varicose veins of right lower extremity with inflammation: Secondary | ICD-10-CM | POA: Diagnosis not present

## 2020-10-19 ENCOUNTER — Other Ambulatory Visit (HOSPITAL_COMMUNITY): Payer: Self-pay | Admitting: Family Medicine

## 2020-10-19 DIAGNOSIS — Z6837 Body mass index (BMI) 37.0-37.9, adult: Secondary | ICD-10-CM | POA: Diagnosis not present

## 2020-10-19 DIAGNOSIS — F411 Generalized anxiety disorder: Secondary | ICD-10-CM | POA: Diagnosis not present

## 2020-10-19 DIAGNOSIS — R739 Hyperglycemia, unspecified: Secondary | ICD-10-CM | POA: Diagnosis not present

## 2020-10-20 MED FILL — metFORMIN HCL ER 500 MG TB2: 500 | 90 days supply | Qty: 90 | Fill #0

## 2020-11-12 DIAGNOSIS — I8311 Varicose veins of right lower extremity with inflammation: Secondary | ICD-10-CM | POA: Diagnosis not present

## 2020-11-12 DIAGNOSIS — I83811 Varicose veins of right lower extremities with pain: Secondary | ICD-10-CM | POA: Diagnosis not present

## 2020-11-26 DIAGNOSIS — I83811 Varicose veins of right lower extremities with pain: Secondary | ICD-10-CM | POA: Diagnosis not present

## 2020-11-26 DIAGNOSIS — I8311 Varicose veins of right lower extremity with inflammation: Secondary | ICD-10-CM | POA: Diagnosis not present

## 2020-12-14 MED FILL — OMEPRAZOLE 40 MG CPDR: 40 | 90 days supply | Qty: 90 | Fill #1

## 2020-12-14 MED FILL — ROSUVASTATIN CALCIUM 5 MG T: 5 | 90 days supply | Qty: 90 | Fill #1

## 2020-12-14 MED FILL — FLUTICASONE PROP 50 MCG SPR: 50 | 90 days supply | Qty: 48 | Fill #1

## 2020-12-15 ENCOUNTER — Other Ambulatory Visit (HOSPITAL_COMMUNITY): Payer: Self-pay | Admitting: Family Medicine

## 2020-12-15 MED FILL — VENLAFAXINE HCL ER 150 MG C: 150 | 90 days supply | Qty: 90 | Fill #0

## 2020-12-21 ENCOUNTER — Ambulatory Visit (INDEPENDENT_AMBULATORY_CARE_PROVIDER_SITE_OTHER): Payer: 59 | Admitting: Gastroenterology

## 2020-12-21 ENCOUNTER — Encounter (INDEPENDENT_AMBULATORY_CARE_PROVIDER_SITE_OTHER): Payer: Self-pay | Admitting: Gastroenterology

## 2020-12-21 ENCOUNTER — Other Ambulatory Visit: Payer: Self-pay

## 2020-12-21 VITALS — BP 129/82 | HR 89 | Temp 98.4°F | Ht 62.0 in | Wt 205.0 lb

## 2020-12-21 DIAGNOSIS — Z86010 Personal history of colonic polyps: Secondary | ICD-10-CM | POA: Diagnosis not present

## 2020-12-21 DIAGNOSIS — Z800 Family history of malignant neoplasm of digestive organs: Secondary | ICD-10-CM | POA: Diagnosis not present

## 2020-12-21 MED ORDER — LIRAGLUTIDE 18 MG/3ML ~~LOC~~ SOPN: 0.6000 mg | PEN_INJECTOR | Freq: Every day | SUBCUTANEOUS | 1 refills | Status: DC

## 2020-12-21 NOTE — Progress Notes (Signed)
Maylon Peppers, M.D. Gastroenterology & Hepatology [facility] [address] [place], [place] [address]  Primary Care Physician: Denny Levy, [place] [address]  I will communicate my assessment and recommendations to the referring MD via EMR.  Problems: 1. Obesity 2. Family history of CRC.  History of Present Illness: Miranda Williams is a 50 y.o. female with past medical history of obesity, diabetes, hyperlipidemia, GERD, who presents for evaluation of obesity.  Patient reports that close to 6 years ago she noticed having increase in her wieght. She used to weight 160 in the past but it slowly kept going up. She reports that she has tried healthy diet in the past but she endorses that she eats meals have a high content of carbohydrates intermittently. She has never had knee surgeries for weight loss in the past and she has not tried any medications previously. She is interested in trying a medication that will help her decrease her appetite while having as less side effects as possible.  The patient denies having any nausea, vomiting, fever, chills, hematochezia, melena, hematemesis, abdominal distention, abdominal pain, diarrhea, jaundice, pruritus.  She will make an appointment with a nutritionist soon.  Last Colonoscopy: 04/15/2019, 2 polyps were removed in the cecum with small size (both were sessile serrated polyps), diverticulosis  FHx: two aunts had liver cirrhosis due to NASH , neg for any gastrointestinal/liver disease, Her father was diagnosed with rectal adenocarcinoma at age 39 and died at 46 of cardiac disease.  Mother was diagnosed with stage IV CRC at age 46, no thyroid cancer Social: neg smoking, alcohol or illicit drug use Surgical: cholecystectomy  Past Medical History: Past Medical History:  Diagnosis Date  . Breast disorder   . Diabetes mellitus without complication ([place])   . Dystrophic  radiologic calcification 03/05/2014   Right breast; removed 02/2013; benign  . Gallstones   . Hyperlipidemia   . Hypertension   . Vaginal Pap smear, abnormal    +HPV    Past Surgical History: Past Surgical History:  Procedure Laterality Date  . CHOLECYSTECTOMY    . COLONOSCOPY N/A 04/15/2019   Procedure: COLONOSCOPY;  Surgeon: Rogene Houston, MD;  Location: [facility];  Service: Endoscopy;  Laterality: N/A;  1030  . NASAL SINUS SURGERY     removal of benign tumor  . POLYPECTOMY  04/15/2019   Procedure: POLYPECTOMY;  Surgeon: Rogene Houston, MD;  Location: [facility];  Service: Endoscopy;;  colon    Family History: Family History  Problem Relation Age of Onset  . Cancer Mother        liver  . Cancer Father        colon cancer    Social History: Social History   Tobacco Use  Smoking Status Never Smoker  Smokeless Tobacco Never Used   Social History   Substance and Sexual Activity  Alcohol Use Yes  . Alcohol/week: 0.0 standard drinks   Comment: socially   Social History   Substance and Sexual Activity  Drug Use No    Allergies: Allergies  Allergen Reactions  . Sulfa Antibiotics Swelling, Rash and Other (See Comments)    fever    Medications: Current Outpatient Medications  Medication Sig Dispense Refill  . fluticasone (FLONASE) 50 MCG/ACT nasal spray Place 2 sprays into both nostrils daily.    Marland Kitchen levonorgestrel (MIRENA) 20 MCG/24HR IUD 1 each by Intrauterine route once.    . metFORMIN (GLUCOPHAGE-XR) 500  MG 24 hr tablet Take 500 mg by mouth daily.    Marland Kitchen omeprazole (PRILOSEC) 40 MG capsule Take 40 mg by mouth daily.    . rosuvastatin (CRESTOR) 5 MG tablet SMARTSIG:1 Tablet(s) By Mouth Every Evening    . sertraline (ZOLOFT) 100 MG tablet Take 100 mg by mouth at bedtime.     No current facility-administered medications for this visit.    Review of Systems: GENERAL: negative for malaise, night sweats HEENT: No changes in hearing or vision, no nose  bleeds or other nasal problems. NECK: Negative for lumps, goiter, pain and significant neck swelling RESPIRATORY: Negative for cough, wheezing CARDIOVASCULAR: Negative for chest pain, leg swelling, palpitations, orthopnea GI: SEE HPI MUSCULOSKELETAL: Negative for joint pain or swelling, back pain, and muscle pain. SKIN: Negative for lesions, rash PSYCH: Negative for sleep disturbance, mood disorder and recent psychosocial stressors. HEMATOLOGY Negative for prolonged bleeding, bruising easily, and swollen nodes. ENDOCRINE: Negative for cold or heat intolerance, polyuria, polydipsia and goiter. NEURO: negative for tremor, gait imbalance, syncope and seizures. The remainder of the review of systems is noncontributory.   Physical Exam: BP 129/82 (BP Location: Left Arm, Patient Position: Sitting, Cuff Size: Large)   Pulse 89   Temp 98.4 F (36.9 C) (Oral)   Ht 5\' 2"  (1.575 m)   Wt 205 lb (93 kg)   BMI 37.49 kg/m  GENERAL: The patient is AO x3, in no acute distress. Obese. HEENT: Head is normocephalic and atraumatic. EOMI are intact. Mouth is well hydrated and without lesions. NECK: Supple. No masses LUNGS: Clear to auscultation. No presence of rhonchi/wheezing/rales. Adequate chest expansion HEART: RRR, normal s1 and s2. ABDOMEN: Soft, nontender, no guarding, no peritoneal signs, and nondistended. BS +. No masses. EXTREMITIES: Without any cyanosis, clubbing, rash, lesions or edema. NEUROLOGIC: AOx3, no focal motor deficit. SKIN: no jaundice, no rashes  Imaging/Labs: as above  I personally reviewed and interpreted the available labs, imaging and endoscopic files.  Impression and Plan: Miranda Williams is a 50 y.o. female with past medical history of obesity, diabetes, hyperlipidemia, GERD, who presents for evaluation of obesity. The patient has presented progressive weight gain over the years with recent development of DM. She also has had difficult to control hyperlipidemia. Based on  the fact the patient has major comorbidites like DM and HLD, she may be an ideal candidate for liraglutide. She does not have thyroid or genetic cancer syndromes in her family. I discussed with her the importance of adhering to the medication regimen compliantly but also making lifestyle changes like implementing adequate diet intake, for which she will see a dietitian soon. We discussed the potential risk of taking medication such as development of acute pancreatitis, nausea and vomiting, which she understands and agrees. We will start with liraglutide 0.6 mg daily with up titration weekly by 0.6 mg up to 3 mg daily. She will stay on the highest dose as she can tolerate. When she starts taking the regular diet, she needs to stop the Metformin.Will check CBC, CMP and lipid panel today.  Finally, will need to perform a colonoscopy in 2023 as she had family history of colon cancer but also had 2 sessile serrated polyps most recent colonoscopy.  - Start liraglutide 0.6 mg daily SQ, increasing 0.6 mg weekly until she develops nausea/vomiting or if 3 mg/day is reached - Stop metformin once liraglutide is started - Check CBC, CMP and lipid panel - Repeat colonoscopy in 2023 - RTC 3 months  All questions were  answered.      Harvel Quale, MD Gastroenterology and Hepatology [facility]

## 2020-12-21 NOTE — Patient Instructions (Signed)
Start liraglutide daily - will start with 0.6 mg for one week and increase 0.6 mg weekly until 3 mg. Do not increase any further if you develop persistent nausea or episodes of vomiting Perform blood workup

## 2020-12-22 ENCOUNTER — Other Ambulatory Visit (HOSPITAL_COMMUNITY): Payer: Self-pay | Admitting: Gastroenterology

## 2020-12-22 LAB — COMPREHENSIVE METABOLIC PANEL
AG Ratio: 1.8 (calc) (ref 1.0–2.5)
ALT: 20 U/L (ref 6–29)
AST: 15 U/L (ref 10–35)
Albumin: 4.2 g/dL (ref 3.6–5.1)
Alkaline phosphatase (APISO): 78 U/L (ref 37–153)
BUN: 10 mg/dL (ref 7–25)
CO2: 25 mmol/L (ref 20–32)
Calcium: 9 mg/dL (ref 8.6–10.4)
Chloride: 104 mmol/L (ref 98–110)
Creat: 0.59 mg/dL (ref 0.50–1.05)
Globulin: 2.4 g/dL (calc) (ref 1.9–3.7)
Glucose, Bld: 142 mg/dL — ABNORMAL HIGH (ref 65–139)
Potassium: 3.9 mmol/L (ref 3.5–5.3)
Sodium: 138 mmol/L (ref 135–146)
Total Bilirubin: 0.3 mg/dL (ref 0.2–1.2)
Total Protein: 6.6 g/dL (ref 6.1–8.1)

## 2020-12-22 LAB — CBC WITH DIFFERENTIAL/PLATELET
Absolute Monocytes: 416 cells/uL (ref 200–950)
Basophils Absolute: 54 cells/uL (ref 0–200)
Basophils Relative: 0.7 %
Eosinophils Absolute: 77 cells/uL (ref 15–500)
Eosinophils Relative: 1 %
HCT: 37.2 % (ref 35.0–45.0)
Hemoglobin: 12.7 g/dL (ref 11.7–15.5)
Lymphs Abs: 2033 cells/uL (ref 850–3900)
MCH: 29.5 pg (ref 27.0–33.0)
MCHC: 34.1 g/dL (ref 32.0–36.0)
MCV: 86.3 fL (ref 80.0–100.0)
MPV: 10.9 fL (ref 7.5–12.5)
Monocytes Relative: 5.4 %
Neutro Abs: 5121 cells/uL (ref 1500–7800)
Neutrophils Relative %: 66.5 %
Platelets: 264 10*3/uL (ref 140–400)
RBC: 4.31 10*6/uL (ref 3.80–5.10)
RDW: 12.1 % (ref 11.0–15.0)
Total Lymphocyte: 26.4 %
WBC: 7.7 10*3/uL (ref 3.8–10.8)

## 2020-12-22 LAB — LIPID PANEL
Cholesterol: 151 mg/dL (ref <200)
HDL: 45 mg/dL — ABNORMAL LOW (ref >=50)
LDL Cholesterol (Calc): 85 mg/dL (calc)
Non-HDL Cholesterol (Calc): 106 mg/dL (calc) (ref <130)
Total CHOL/HDL Ratio: 3.4 (calc) (ref <5.0)
Triglycerides: 111 mg/dL (ref <150)

## 2020-12-23 ENCOUNTER — Other Ambulatory Visit (INDEPENDENT_AMBULATORY_CARE_PROVIDER_SITE_OTHER): Payer: Self-pay

## 2020-12-23 MED ORDER — SAXENDA 18 MG/3ML ~~LOC~~ SOPN: PEN_INJECTOR | SUBCUTANEOUS | 3 refills | Status: DC

## 2020-12-25 ENCOUNTER — Other Ambulatory Visit (HOSPITAL_COMMUNITY): Payer: Self-pay | Admitting: Family Medicine

## 2020-12-25 DIAGNOSIS — Z1231 Encounter for screening mammogram for malignant neoplasm of breast: Secondary | ICD-10-CM

## 2020-12-25 MED FILL — SAXENDA 18 MG/3 ML PEN: 18 | 30 days supply | Qty: 15 | Fill #0

## 2020-12-25 MED FILL — UNIFINE PENTIPS 6MM 31G: 31G X 6 MM | 90 days supply | Qty: 100 | Fill #0

## 2021-01-21 ENCOUNTER — Other Ambulatory Visit (HOSPITAL_BASED_OUTPATIENT_CLINIC_OR_DEPARTMENT_OTHER): Payer: Self-pay

## 2021-02-10 ENCOUNTER — Ambulatory Visit (INDEPENDENT_AMBULATORY_CARE_PROVIDER_SITE_OTHER): Payer: 59 | Admitting: Gastroenterology

## 2021-02-14 MED FILL — Liraglutide (Weight Mngmt) Soln Pen-Inj 18 MG/3ML (6 MG/ML): SUBCUTANEOUS | 30 days supply | Qty: 15 | Fill #0 | Status: AC

## 2021-02-15 ENCOUNTER — Other Ambulatory Visit (HOSPITAL_COMMUNITY): Payer: Self-pay

## 2021-03-08 ENCOUNTER — Other Ambulatory Visit (HOSPITAL_COMMUNITY): Payer: Self-pay

## 2021-03-14 ENCOUNTER — Other Ambulatory Visit (HOSPITAL_COMMUNITY): Payer: Self-pay

## 2021-03-14 MED FILL — Liraglutide (Weight Mngmt) Soln Pen-Inj 18 MG/3ML (6 MG/ML): SUBCUTANEOUS | 30 days supply | Qty: 15 | Fill #1 | Status: AC

## 2021-03-15 ENCOUNTER — Other Ambulatory Visit (HOSPITAL_COMMUNITY): Payer: Self-pay

## 2021-03-16 ENCOUNTER — Other Ambulatory Visit (HOSPITAL_COMMUNITY): Payer: Self-pay

## 2021-03-16 MED ORDER — VENLAFAXINE HCL ER 150 MG PO CP24
ORAL_CAPSULE | ORAL | 0 refills | Status: DC
  Filled 2021-03-16: qty 90, 90d supply, fill #0

## 2021-03-16 MED ORDER — ROSUVASTATIN CALCIUM 5 MG PO TABS
ORAL_TABLET | ORAL | 1 refills | Status: DC
  Filled 2021-03-16: qty 90, 90d supply, fill #0
  Filled 2021-07-20: qty 90, 90d supply, fill #1

## 2021-03-17 DIAGNOSIS — F411 Generalized anxiety disorder: Secondary | ICD-10-CM | POA: Diagnosis not present

## 2021-03-19 ENCOUNTER — Other Ambulatory Visit (HOSPITAL_COMMUNITY): Payer: Self-pay

## 2021-03-22 ENCOUNTER — Telehealth (INDEPENDENT_AMBULATORY_CARE_PROVIDER_SITE_OTHER): Payer: 59 | Admitting: Gastroenterology

## 2021-03-22 ENCOUNTER — Encounter (INDEPENDENT_AMBULATORY_CARE_PROVIDER_SITE_OTHER): Payer: Self-pay | Admitting: Gastroenterology

## 2021-03-22 NOTE — Patient Instructions (Signed)
Continue Saxenda 3 mg qday Continue working on diet and exercise

## 2021-03-22 NOTE — Progress Notes (Signed)
Miranda Williams, M.D. Gastroenterology & Hepatology [facility] [address] [place], [place] [address] Primary Care Physician: Denny Levy, [place] [address]  This is a telephone virtual visit.  It required patient-provider interaction for the medical decision making as documented below. The patient has consented and agreed to proceed with a Telehealth encounter given the current Coronavirus pandemic.  VIRTUAL VISIT NOTE Patient location: Home Provider location: Home  I will communicate my assessment and recommendations to the referring MD via EMR.  Problems: 1. Obesity 2. Family history of CRC.  History of Present Illness: Miranda Williams is a 50 y.o.  female with past medical history of obesity, diabetes, hyperlipidemia, GERD, who presents for follow-up of obesity.  The patient was last seen on 12/21/2020.  She was started on Saxenda 0.6 mg daily subcutaneously and she was advised to stop taking metformin at that time.  Patient has been able to increase her Saxenda compliantly, has been taking 3 mg every day for the last 2 days.  2 days ago she just switched to morning injection instead of night dosing which she believes she has tolerated better. Denies nausea or vomiting, but has felt more constipated with the medication.  However, she reports that with the intake of MiraLAX her symptoms have much more improved.  She saw her nutritionist since her last appointment in our clinic, has tried to follow her recommendations but has had a hard a time eating every 2 hours.  However, she has tried to cut down on her carbs intake.  Last weight: 190 lb yesterday, baseline was 205 pounds, has decreased 15 lb since the last time she was seen in clinic.  Most recent BMI 33.  The patient denies having any nausea, vomiting, fever, chills, hematochezia, melena, hematemesis, abdominal distention, abdominal pain, diarrhea,  jaundice, pruritus.  Last Colonoscopy: 04/15/2019, 2 polyps were removed in the cecum with small size (both were sessile serrated polyps), diverticulosis  Past Medical History: Past Medical History:  Diagnosis Date  . Breast disorder   . Diabetes mellitus without complication ([place])   . Dystrophic radiologic calcification 03/05/2014   Right breast; removed 02/2013; benign  . Gallstones   . Hyperlipidemia   . Hypertension   . Vaginal Pap smear, abnormal    +HPV    Past Surgical History: Past Surgical History:  Procedure Laterality Date  . CHOLECYSTECTOMY    . COLONOSCOPY N/A 04/15/2019   Procedure: COLONOSCOPY;  Surgeon: Rogene Houston, MD;  Location: [facility];  Service: Endoscopy;  Laterality: N/A;  1030  . NASAL SINUS SURGERY     removal of benign tumor  . POLYPECTOMY  04/15/2019   Procedure: POLYPECTOMY;  Surgeon: Rogene Houston, MD;  Location: [facility];  Service: Endoscopy;;  colon    Family History: Family History  Problem Relation Age of Onset  . Cancer Mother        liver  . Cancer Father        colon cancer    Social History: Social History   Tobacco Use  Smoking Status Never Smoker  Smokeless Tobacco Never Used   Social History   Substance and Sexual Activity  Alcohol Use Yes  . Alcohol/week: 0.0 standard drinks   Comment: socially   Social History   Substance and Sexual Activity  Drug Use No    Allergies: Allergies  Allergen Reactions  . Sulfa Antibiotics Swelling, Rash and Other (See Comments)  fever    Medications: Current Outpatient Medications  Medication Sig Dispense Refill  . fluticasone (FLONASE) 50 MCG/ACT nasal spray Place 2 sprays into both nostrils daily.    . Insulin Pen Needle 31G X 6 MM MISC USE TO INJECT SAXENDA 100 each 1  . lansoprazole (PREVACID) 15 MG capsule Take 15 mg by mouth daily at 12 noon.    Marland Kitchen levonorgestrel (MIRENA) 20 MCG/24HR IUD 1 each by Intrauterine route once.    . Liraglutide -Weight  Management (SAXENDA) 18 MG/3ML SOPN Inject 0.6 mg daily into skin. Increase 0.6 mg every week until you reach 3 daily or you develop nausea/vomiting 3 mL 3  . rosuvastatin (CRESTOR) 5 MG tablet Take 1 tablet by mouth every evening 90 tablet 1  . venlafaxine XR (EFFEXOR-XR) 150 MG 24 hr capsule Take 1 capsule by mouth once daily 90 capsule 0   No current facility-administered medications for this visit.    Review of Systems: GENERAL: negative for malaise, night sweats HEENT: No changes in hearing or vision, no nose bleeds or other nasal problems. NECK: Negative for lumps, goiter, pain and significant neck swelling RESPIRATORY: Negative for cough, wheezing CARDIOVASCULAR: Negative for chest pain, leg swelling, palpitations, orthopnea GI: SEE HPI MUSCULOSKELETAL: Negative for joint pain or swelling, back pain, and muscle pain. SKIN: Negative for lesions, rash PSYCH: Negative for sleep disturbance, mood disorder and recent psychosocial stressors. HEMATOLOGY Negative for prolonged bleeding, bruising easily, and swollen nodes. ENDOCRINE: Negative for cold or heat intolerance, polyuria, polydipsia and goiter. NEURO: negative for tremor, gait imbalance, syncope and seizures. The remainder of the review of systems is noncontributory.   Physical Exam: No exam was performed as this was a telephone encounter  Imaging/Labs: as above  I personally reviewed and interpreted the available labs, imaging and endoscopic files.  Impression and Plan: Miranda Williams is a 50 y.o.  female with past medical history of obesity, diabetes, hyperlipidemia, GERD, who presents for follow-up of obesity.  The patient has presented some improvement in her weight after starting Saxenda.  Fortunately she has been able to tolerate the max dose after titrating slowly her dosage.  Overall, I emphasized over the importance of adhering to the dietary recommendations she was advised.  We will continue her Saxenda at the same  dosage for now and will we will see her in 6 months.  Fortunately, the Kirke Shaggy has also helped her control her diabetes as her fingersticks have been adequate.  We will continue this medication for now.  Patient understood and agreed.  -Continue Saxenda 3 mg SQ qday -Continue working on diet and exercise -Repeat colonoscopy in 2023  All questions were answered.      Total visit time: I spent a total of  20 minutes  Miranda Peppers, MD Gastroenterology and Hepatology [facility]

## 2021-03-24 ENCOUNTER — Other Ambulatory Visit (HOSPITAL_COMMUNITY): Payer: Self-pay

## 2021-03-24 DIAGNOSIS — F411 Generalized anxiety disorder: Secondary | ICD-10-CM | POA: Diagnosis not present

## 2021-03-24 MED ORDER — PROPRANOLOL HCL 10 MG PO TABS
ORAL_TABLET | ORAL | 1 refills | Status: DC
  Filled 2021-03-24: qty 90, 30d supply, fill #0

## 2021-03-24 MED FILL — Liraglutide (Weight Mngmt) Soln Pen-Inj 18 MG/3ML (6 MG/ML): SUBCUTANEOUS | 30 days supply | Qty: 15 | Fill #2 | Status: CN

## 2021-03-25 ENCOUNTER — Ambulatory Visit (HOSPITAL_COMMUNITY)
Admission: RE | Admit: 2021-03-25 | Discharge: 2021-03-25 | Disposition: A | Discharge status: Home / Self Care | Payer: 59 | Source: Ambulatory Visit | Attending: Family Medicine | Admitting: Family Medicine

## 2021-03-25 ENCOUNTER — Other Ambulatory Visit: Payer: Self-pay

## 2021-03-25 DIAGNOSIS — Z1231 Encounter for screening mammogram for malignant neoplasm of breast: Secondary | ICD-10-CM | POA: Diagnosis not present

## 2021-04-20 ENCOUNTER — Other Ambulatory Visit (HOSPITAL_COMMUNITY): Payer: Self-pay

## 2021-04-20 DIAGNOSIS — F411 Generalized anxiety disorder: Secondary | ICD-10-CM | POA: Diagnosis not present

## 2021-04-20 MED ORDER — VENLAFAXINE HCL ER 37.5 MG PO CP24
37.5000 mg | ORAL_CAPSULE | Freq: Every day | ORAL | 0 refills | Status: DC
  Filled 2021-04-20: qty 7, 7d supply, fill #0

## 2021-04-20 MED ORDER — VENLAFAXINE HCL ER 75 MG PO CP24
75.0000 mg | ORAL_CAPSULE | Freq: Every day | ORAL | 0 refills | Status: DC
  Filled 2021-04-20: qty 7, 7d supply, fill #0

## 2021-04-20 MED ORDER — FLUOXETINE HCL 10 MG PO CAPS
ORAL_CAPSULE | ORAL | 1 refills | Status: DC
  Filled 2021-04-20: qty 30, 18d supply, fill #0
  Filled 2021-05-09: qty 30, 18d supply, fill #1

## 2021-04-25 MED FILL — Insulin Pen Needle 31 G X 6 MM (1/4" or 15/64"): 90 days supply | Qty: 100 | Fill #0 | Status: CN

## 2021-04-25 MED FILL — Liraglutide (Weight Mngmt) Soln Pen-Inj 18 MG/3ML (6 MG/ML): SUBCUTANEOUS | 30 days supply | Qty: 15 | Fill #2 | Status: AC

## 2021-04-26 ENCOUNTER — Other Ambulatory Visit (HOSPITAL_COMMUNITY): Payer: Self-pay

## 2021-04-26 MED FILL — Insulin Pen Needle 31 G X 6 MM (1/4" or 15/64"): 90 days supply | Qty: 100 | Fill #0 | Status: AC

## 2021-04-27 ENCOUNTER — Other Ambulatory Visit (HOSPITAL_COMMUNITY): Payer: Self-pay

## 2021-05-10 ENCOUNTER — Other Ambulatory Visit (HOSPITAL_COMMUNITY): Payer: Self-pay

## 2021-05-13 DIAGNOSIS — G4710 Hypersomnia, unspecified: Secondary | ICD-10-CM | POA: Diagnosis not present

## 2021-05-14 DIAGNOSIS — G4710 Hypersomnia, unspecified: Secondary | ICD-10-CM | POA: Diagnosis not present

## 2021-05-25 ENCOUNTER — Other Ambulatory Visit (HOSPITAL_COMMUNITY): Payer: Self-pay

## 2021-05-26 ENCOUNTER — Other Ambulatory Visit (HOSPITAL_COMMUNITY): Payer: Self-pay

## 2021-05-26 ENCOUNTER — Other Ambulatory Visit (INDEPENDENT_AMBULATORY_CARE_PROVIDER_SITE_OTHER): Payer: Self-pay | Admitting: Gastroenterology

## 2021-05-26 DIAGNOSIS — H5203 Hypermetropia, bilateral: Secondary | ICD-10-CM | POA: Diagnosis not present

## 2021-05-26 MED ORDER — SAXENDA 18 MG/3ML ~~LOC~~ SOPN
PEN_INJECTOR | SUBCUTANEOUS | 3 refills | Status: DC
  Filled 2021-05-26: qty 15, 30d supply, fill #0
  Filled 2021-06-26: qty 15, 30d supply, fill #1
  Filled 2021-08-10: qty 15, 30d supply, fill #2
  Filled 2021-10-08: qty 15, 30d supply, fill #3

## 2021-05-27 ENCOUNTER — Other Ambulatory Visit (HOSPITAL_COMMUNITY): Payer: Self-pay

## 2021-05-27 DIAGNOSIS — G4733 Obstructive sleep apnea (adult) (pediatric): Secondary | ICD-10-CM | POA: Diagnosis not present

## 2021-05-27 MED ORDER — FLUOXETINE HCL 20 MG PO TABS
ORAL_TABLET | ORAL | 1 refills | Status: DC
  Filled 2021-05-27: qty 30, 30d supply, fill #0
  Filled 2021-06-23: qty 30, 30d supply, fill #1

## 2021-05-28 ENCOUNTER — Other Ambulatory Visit (HOSPITAL_COMMUNITY): Payer: Self-pay

## 2021-05-31 ENCOUNTER — Other Ambulatory Visit (HOSPITAL_COMMUNITY): Payer: Self-pay

## 2021-05-31 DIAGNOSIS — F411 Generalized anxiety disorder: Secondary | ICD-10-CM | POA: Diagnosis not present

## 2021-05-31 MED ORDER — FLUOXETINE HCL 20 MG PO TABS
ORAL_TABLET | ORAL | 0 refills | Status: DC
  Filled 2021-05-31: qty 90, 90d supply, fill #0

## 2021-06-02 ENCOUNTER — Other Ambulatory Visit (HOSPITAL_COMMUNITY)
Admission: RE | Admit: 2021-06-02 | Discharge: 2021-06-02 | Disposition: A | Discharge status: Home / Self Care | Payer: 59 | Source: Ambulatory Visit | Attending: Adult Health | Admitting: Adult Health

## 2021-06-02 ENCOUNTER — Ambulatory Visit (INDEPENDENT_AMBULATORY_CARE_PROVIDER_SITE_OTHER): Payer: 59 | Admitting: Adult Health

## 2021-06-02 ENCOUNTER — Other Ambulatory Visit: Payer: Self-pay

## 2021-06-02 ENCOUNTER — Encounter: Payer: Self-pay | Admitting: Adult Health

## 2021-06-02 VITALS — BP 110/78 | HR 78 | Ht 62.0 in | Wt 200.0 lb

## 2021-06-02 DIAGNOSIS — N951 Menopausal and female climacteric states: Secondary | ICD-10-CM | POA: Diagnosis not present

## 2021-06-02 DIAGNOSIS — Z01419 Encounter for gynecological examination (general) (routine) without abnormal findings: Secondary | ICD-10-CM | POA: Insufficient documentation

## 2021-06-02 DIAGNOSIS — Z975 Presence of (intrauterine) contraceptive device: Secondary | ICD-10-CM | POA: Diagnosis not present

## 2021-06-02 NOTE — Progress Notes (Signed)
Patient ID: Miranda Williams, female   DOB: 11/23/1970, 50 y.o.   MRN: [id number] History of Present Illness: Miranda Williams is a 50 year old white female,divorced, G1P1, in for a well woman gyn exam and pap. PCP is [employer] PA.   Current Medications, Allergies, Past Medical History, Past Surgical History, Family History and Social History were reviewed in [employer] record.     Review of Systems: Patient denies any headaches, hearing loss, fatigue, blurred vision, shortness of breath, chest pain, abdominal pain, problems with bowel movements, urination, or intercourse.(Not active). No joint pain or mood swings.  Has liletta and no periods.   Physical Exam:BP 110/78 (BP Location: Left Arm, Patient Position: Sitting, Cuff Size: Normal)   Pulse 78   Ht '5\' 2"'$  (1.575 m)   Wt 200 lb (90.7 kg)   BMI 36.58 kg/m   General:  Well developed, well nourished, no acute distress Skin:  Warm and dry Neck:  Midline trachea, normal thyroid, good ROM, no lymphadenopathy Lungs; Clear to auscultation bilaterally Breast:  No dominant palpable mass, retraction, or nipple discharge Cardiovascular: Regular rate and rhythm Abdomen:  Soft, non tender, no hepatosplenomegaly Pelvic:  External genitalia is normal in appearance, no lesions.  The vagina is normal in appearance. Urethra has no lesions or masses. The cervix is bulbous. +IUD strings at os, pap with HR HPV genotyping performed, cervix bleed some. Uterus is felt to be normal size, shape, and contour.  No adnexal masses or tenderness noted.Bladder is non tender, no masses felt. Rectal: Deferred Extremities/musculoskeletal:  No swelling or varicosities noted, no clubbing or cyanosis Psych:  No mood changes, alert and cooperative,seems happy AA is 2 Fall risk is low Depression screen [facility] 2/9 06/02/2021 06/01/2020 05/28/2019  Decreased Interest 0 0 0  Down, Depressed, Hopeless 1 0 0  PHQ - 2 Score 1 0 0  Altered sleeping 1 1 -  Tired,  decreased energy 1 0 -  Change in appetite 3 3 -  Feeling bad or failure about yourself  1 3 -  Trouble concentrating 1 0 -  Moving slowly or fidgety/restless 0 0 -  Suicidal thoughts 0 0 -  PHQ-9 Score 8 7 -  Difficult doing work/chores - Not difficult at all -   On Prozac  GAD 7 : Generalized Anxiety Score 06/02/2021 06/01/2020  Nervous, Anxious, on Edge 1 0  Control/stop worrying 0 0  Worry too much - different things 0 0  Trouble relaxing 0 0  Restless 0 0  Easily annoyed or irritable 0 0  Afraid - awful might happen 0 0  Total GAD 7 Score 1 0  Anxiety Difficulty - Not difficult at all      Upstream - 06/02/21 1528       Pregnancy Intention Screening   Does the patient want to become pregnant in the next year? No    Does the patient's partner want to become pregnant in the next year? No    Would the patient like to discuss contraceptive options today? No      Contraception Wrap Up   Current Method IUD or IUS    End Method IUD or IUS    Contraception Counseling Provided No            Examination chaperoned by Celene Squibb LPN  Impression and Plan: 1. Encounter for gynecological examination with Papanicolaou smear of cervix Pap sent Physical in 1 year Pap in 3 if normal Mammogram yearly Colonoscopy next year  Had labs 12/2020   2. IUD (intrauterine device) in place Placed 12/31/2014, leave in place, if has sex use condoms  3. Peri-menopause

## 2021-06-07 LAB — CYTOLOGY - PAP
Comment: NEGATIVE
Diagnosis: NEGATIVE
High risk HPV: NEGATIVE

## 2021-06-11 DIAGNOSIS — G4733 Obstructive sleep apnea (adult) (pediatric): Secondary | ICD-10-CM | POA: Diagnosis not present

## 2021-06-14 DIAGNOSIS — M79642 Pain in left hand: Secondary | ICD-10-CM | POA: Diagnosis not present

## 2021-06-14 DIAGNOSIS — M67432 Ganglion, left wrist: Secondary | ICD-10-CM | POA: Diagnosis not present

## 2021-06-23 ENCOUNTER — Other Ambulatory Visit (HOSPITAL_COMMUNITY): Payer: Self-pay

## 2021-06-26 ENCOUNTER — Other Ambulatory Visit (HOSPITAL_COMMUNITY): Payer: Self-pay

## 2021-06-28 ENCOUNTER — Other Ambulatory Visit (HOSPITAL_COMMUNITY): Payer: Self-pay

## 2021-06-29 ENCOUNTER — Other Ambulatory Visit (HOSPITAL_COMMUNITY): Payer: Self-pay

## 2021-07-13 DIAGNOSIS — G4733 Obstructive sleep apnea (adult) (pediatric): Secondary | ICD-10-CM | POA: Diagnosis not present

## 2021-07-14 DIAGNOSIS — G4733 Obstructive sleep apnea (adult) (pediatric): Secondary | ICD-10-CM | POA: Diagnosis not present

## 2021-07-20 ENCOUNTER — Other Ambulatory Visit (HOSPITAL_COMMUNITY): Payer: Self-pay

## 2021-07-22 ENCOUNTER — Other Ambulatory Visit (HOSPITAL_COMMUNITY): Payer: Self-pay

## 2021-07-22 MED ORDER — FLUOXETINE HCL 20 MG PO CAPS
20.0000 mg | ORAL_CAPSULE | Freq: Every day | ORAL | 0 refills | Status: DC
  Filled 2021-07-22: qty 90, 90d supply, fill #0

## 2021-08-10 ENCOUNTER — Other Ambulatory Visit (HOSPITAL_COMMUNITY): Payer: Self-pay

## 2021-08-10 MED ORDER — MELOXICAM 15 MG PO TABS
15.0000 mg | ORAL_TABLET | Freq: Every morning | ORAL | 1 refills | Status: DC
  Filled 2021-08-10: qty 30, 30d supply, fill #0

## 2021-08-23 DIAGNOSIS — F411 Generalized anxiety disorder: Secondary | ICD-10-CM | POA: Diagnosis not present

## 2021-08-24 ENCOUNTER — Other Ambulatory Visit (HOSPITAL_COMMUNITY): Payer: Self-pay

## 2021-08-24 DIAGNOSIS — D2112 Benign neoplasm of connective and other soft tissue of left upper limb, including shoulder: Secondary | ICD-10-CM | POA: Diagnosis not present

## 2021-08-24 DIAGNOSIS — G8918 Other acute postprocedural pain: Secondary | ICD-10-CM | POA: Diagnosis not present

## 2021-08-24 DIAGNOSIS — R2232 Localized swelling, mass and lump, left upper limb: Secondary | ICD-10-CM | POA: Diagnosis not present

## 2021-08-24 MED ORDER — OXYCODONE HCL 5 MG PO TABS
ORAL_TABLET | ORAL | 0 refills | Status: DC
  Filled 2021-08-24: qty 42, 7d supply, fill #0

## 2021-08-24 MED ORDER — DOXYCYCLINE HYCLATE 100 MG PO CAPS
ORAL_CAPSULE | ORAL | 0 refills | Status: DC
  Filled 2021-08-24: qty 28, 14d supply, fill #0

## 2021-08-24 MED ORDER — ONDANSETRON 4 MG PO TBDP
ORAL_TABLET | ORAL | 1 refills | Status: DC
  Filled 2021-08-24: qty 15, 4d supply, fill #0
  Filled 2021-10-08: qty 15, 4d supply, fill #1

## 2021-09-08 DIAGNOSIS — Z4789 Encounter for other orthopedic aftercare: Secondary | ICD-10-CM | POA: Diagnosis not present

## 2021-09-08 DIAGNOSIS — M67432 Ganglion, left wrist: Secondary | ICD-10-CM | POA: Diagnosis not present

## 2021-09-08 DIAGNOSIS — M79642 Pain in left hand: Secondary | ICD-10-CM | POA: Diagnosis not present

## 2021-09-15 DIAGNOSIS — M25632 Stiffness of left wrist, not elsewhere classified: Secondary | ICD-10-CM | POA: Diagnosis not present

## 2021-09-27 ENCOUNTER — Ambulatory Visit (INDEPENDENT_AMBULATORY_CARE_PROVIDER_SITE_OTHER): Payer: 59 | Admitting: Gastroenterology

## 2021-09-28 DIAGNOSIS — M25632 Stiffness of left wrist, not elsewhere classified: Secondary | ICD-10-CM | POA: Diagnosis not present

## 2021-10-07 DIAGNOSIS — G4733 Obstructive sleep apnea (adult) (pediatric): Secondary | ICD-10-CM | POA: Diagnosis not present

## 2021-10-08 ENCOUNTER — Other Ambulatory Visit (HOSPITAL_COMMUNITY): Payer: Self-pay

## 2021-10-11 ENCOUNTER — Other Ambulatory Visit (HOSPITAL_COMMUNITY): Payer: Self-pay

## 2021-10-11 MED ORDER — ROSUVASTATIN CALCIUM 5 MG PO TABS
ORAL_TABLET | ORAL | 1 refills | Status: DC
  Filled 2021-10-11: qty 90, 90d supply, fill #0
  Filled 2021-12-29: qty 90, 90d supply, fill #1

## 2021-10-12 ENCOUNTER — Other Ambulatory Visit (HOSPITAL_COMMUNITY): Payer: Self-pay

## 2021-10-12 MED ORDER — FLUOXETINE HCL 20 MG PO CAPS
20.0000 mg | ORAL_CAPSULE | Freq: Every day | ORAL | 0 refills | Status: DC
  Filled 2021-10-12: qty 90, 90d supply, fill #0

## 2021-11-08 DIAGNOSIS — G4733 Obstructive sleep apnea (adult) (pediatric): Secondary | ICD-10-CM | POA: Diagnosis not present

## 2021-11-11 ENCOUNTER — Other Ambulatory Visit (HOSPITAL_COMMUNITY): Payer: Self-pay

## 2021-11-11 DIAGNOSIS — F411 Generalized anxiety disorder: Secondary | ICD-10-CM | POA: Diagnosis not present

## 2021-11-11 MED ORDER — FLUOXETINE HCL 10 MG PO CAPS
ORAL_CAPSULE | ORAL | 1 refills | Status: DC
  Filled 2021-11-11: qty 30, 30d supply, fill #0
  Filled 2022-03-14: qty 30, 30d supply, fill #1

## 2021-11-15 ENCOUNTER — Other Ambulatory Visit (HOSPITAL_COMMUNITY): Payer: Self-pay

## 2021-12-07 DIAGNOSIS — F411 Generalized anxiety disorder: Secondary | ICD-10-CM | POA: Diagnosis not present

## 2021-12-08 ENCOUNTER — Other Ambulatory Visit (HOSPITAL_COMMUNITY): Payer: Self-pay

## 2021-12-08 MED ORDER — FLUOXETINE HCL 10 MG PO CAPS
ORAL_CAPSULE | ORAL | 2 refills | Status: DC
  Filled 2021-12-08: qty 30, 30d supply, fill #0
  Filled 2021-12-30: qty 30, 30d supply, fill #1
  Filled 2022-02-07: qty 30, 30d supply, fill #2

## 2021-12-22 ENCOUNTER — Other Ambulatory Visit (INDEPENDENT_AMBULATORY_CARE_PROVIDER_SITE_OTHER): Payer: Self-pay | Admitting: Gastroenterology

## 2021-12-22 ENCOUNTER — Other Ambulatory Visit (HOSPITAL_COMMUNITY): Payer: Self-pay

## 2021-12-22 MED ORDER — SAXENDA 18 MG/3ML ~~LOC~~ SOPN
PEN_INJECTOR | SUBCUTANEOUS | 3 refills | Status: DC
  Filled 2021-12-22: qty 15, 28d supply, fill #0
  Filled 2022-02-07: qty 15, 30d supply, fill #1
  Filled 2022-03-24: qty 15, 30d supply, fill #2
  Filled 2022-04-29: qty 15, 30d supply, fill #3

## 2021-12-29 ENCOUNTER — Other Ambulatory Visit (HOSPITAL_COMMUNITY): Payer: Self-pay

## 2021-12-29 MED ORDER — FLUOXETINE HCL 20 MG PO CAPS
20.0000 mg | ORAL_CAPSULE | Freq: Every day | ORAL | 0 refills | Status: DC
  Filled 2021-12-29: qty 90, 90d supply, fill #0

## 2021-12-30 ENCOUNTER — Other Ambulatory Visit (HOSPITAL_COMMUNITY): Payer: Self-pay

## 2022-01-01 ENCOUNTER — Other Ambulatory Visit (HOSPITAL_COMMUNITY): Payer: Self-pay

## 2022-01-11 DIAGNOSIS — Z6838 Body mass index (BMI) 38.0-38.9, adult: Secondary | ICD-10-CM | POA: Diagnosis not present

## 2022-01-11 DIAGNOSIS — F411 Generalized anxiety disorder: Secondary | ICD-10-CM | POA: Diagnosis not present

## 2022-01-11 DIAGNOSIS — Z0000 Encounter for general adult medical examination without abnormal findings: Secondary | ICD-10-CM | POA: Diagnosis not present

## 2022-01-11 DIAGNOSIS — E7849 Other hyperlipidemia: Secondary | ICD-10-CM | POA: Diagnosis not present

## 2022-01-11 DIAGNOSIS — R739 Hyperglycemia, unspecified: Secondary | ICD-10-CM | POA: Diagnosis not present

## 2022-01-11 DIAGNOSIS — E782 Mixed hyperlipidemia: Secondary | ICD-10-CM | POA: Diagnosis not present

## 2022-02-02 ENCOUNTER — Encounter (INDEPENDENT_AMBULATORY_CARE_PROVIDER_SITE_OTHER): Payer: Self-pay | Admitting: *Deleted

## 2022-02-07 ENCOUNTER — Other Ambulatory Visit (HOSPITAL_COMMUNITY): Payer: Self-pay

## 2022-02-22 ENCOUNTER — Encounter (INDEPENDENT_AMBULATORY_CARE_PROVIDER_SITE_OTHER): Payer: Self-pay | Admitting: *Deleted

## 2022-03-06 ENCOUNTER — Other Ambulatory Visit: Payer: Self-pay

## 2022-03-06 ENCOUNTER — Emergency Department (HOSPITAL_COMMUNITY): Payer: 59

## 2022-03-06 ENCOUNTER — Encounter (HOSPITAL_COMMUNITY): Payer: Self-pay

## 2022-03-06 ENCOUNTER — Emergency Department (HOSPITAL_COMMUNITY)
Admission: EM | Admit: 2022-03-06 | Discharge: 2022-03-06 | Disposition: A | Discharge status: Home / Self Care | Payer: 59 | Attending: Emergency Medicine | Admitting: Emergency Medicine

## 2022-03-06 DIAGNOSIS — S83004A Unspecified dislocation of right patella, initial encounter: Secondary | ICD-10-CM | POA: Insufficient documentation

## 2022-03-06 DIAGNOSIS — S80919A Unspecified superficial injury of unspecified knee, initial encounter: Secondary | ICD-10-CM | POA: Diagnosis not present

## 2022-03-06 DIAGNOSIS — W541XXA Struck by dog, initial encounter: Secondary | ICD-10-CM | POA: Diagnosis not present

## 2022-03-06 DIAGNOSIS — M25461 Effusion, right knee: Secondary | ICD-10-CM | POA: Diagnosis not present

## 2022-03-06 DIAGNOSIS — S8991XA Unspecified injury of right lower leg, initial encounter: Secondary | ICD-10-CM | POA: Diagnosis not present

## 2022-03-06 DIAGNOSIS — W19XXXA Unspecified fall, initial encounter: Secondary | ICD-10-CM | POA: Diagnosis not present

## 2022-03-06 DIAGNOSIS — S83014A Lateral dislocation of right patella, initial encounter: Secondary | ICD-10-CM | POA: Diagnosis not present

## 2022-03-06 MED ORDER — DIAZEPAM 5 MG/ML IJ SOLN
5.0000 mg | Freq: Once | INTRAMUSCULAR | Status: AC
  Administered 2022-03-06: 5 mg via INTRAVENOUS
  Filled 2022-03-06: qty 2

## 2022-03-06 MED ORDER — FENTANYL CITRATE PF 50 MCG/ML IJ SOSY
50.0000 ug | PREFILLED_SYRINGE | Freq: Once | INTRAMUSCULAR | Status: AC
  Administered 2022-03-06: 50 ug via INTRAVENOUS
  Filled 2022-03-06: qty 1

## 2022-03-06 MED ORDER — OXYCODONE-ACETAMINOPHEN 5-325 MG PO TABS
1.0000 | ORAL_TABLET | Freq: Three times a day (TID) | ORAL | 0 refills | Status: AC | PRN
  Filled 2022-03-06: qty 12, 4d supply, fill #0

## 2022-03-06 NOTE — Discharge Instructions (Signed)
You have dislocated your patella which I have reduced, you are in a knee immobilizer please wear at all times, may remove to shower.  I have given you crutches for comfort, you can remain weightbearing as tolerated.  I recommend keeping elevate apply ice to area will not use.  Use over-the-counter pain medication as needed. ? ?I have given you a short course of narcotics please take as prescribed.  This medication can make you drowsy do not consume alcohol or operate heavy machinery when taking this medication.  This medication is Tylenol in it do not take Tylenol and take this medication.   ? ?Please follow-up with Ortho for further evaluation. ? ?Come back to the emergency department if you develop chest pain, shortness of breath, severe abdominal pain, uncontrolled nausea, vomiting, diarrhea. ? ? ? ? ?

## 2022-03-06 NOTE — ED Provider Notes (Signed)
?[facility] ?Provider Note ? ? ?CSN: [id number] ?Arrival date & time: 03/06/22  1002 ? ?  ? ?History ? ?Chief Complaint  ?Patient presents with  ? Leg Injury  ? ? ?Miranda Williams is a 51 y.o. female. ? ?HPI ? ?Patient without significant medical history presents with complaints of right knee pain.  Patient states that she was laying her dog back in an unfortunate the dog hit her right knee causing her to fall.  She denies hitting her head losing conscious is not on anticoag she states that she has severe pain in her right knee, she is unable to bend it due to pain, she states pain remains around her kneecap, denies any paresthesia or weakness moving down her leg, states that she still able to wiggle her toes and ankle.  She has no other complaints this time. ? ?EMS provided with 10 of morphine noted obvious deformity of the right knee. ? ?Home Medications ?Prior to Admission medications   ?Medication Sig Start Date End Date Taking? Authorizing Provider  ?doxycycline (VIBRAMYCIN) 100 MG capsule Take 1 capsule by mouth twice daily for 14 days. 08/24/21     ?FLUoxetine (PROZAC) 10 MG capsule Take 1 capsule by mouth daily with 20 mg capsule for a total of 30 mg per day 11/11/21     ?FLUoxetine (PROZAC) 10 MG capsule Take 1 capsule by mouth once daily (take with 20 mg capsule for a total of 30 mg per day) 12/07/21     ?FLUoxetine (PROZAC) 20 MG capsule Take 1 capsule by mouth daily 12/29/21     ?fluticasone (FLONASE) 50 MCG/ACT nasal spray Place 2 sprays into both nostrils daily. 12/14/20   [provider]  ?levonorgestrel (MIRENA) 20 MCG/24HR IUD 1 each by Intrauterine route once.    [provider]  ?Liraglutide -Weight Management (SAXENDA) 18 MG/3ML SOPN INJECT 0.6 MG INTO THE SKIN DAILY. INCREASE BY 0.6 MG EVERY WEEK UNTIL 3 MG REACHED OR UNTIL YOU DEVELOP NAUSEA/VOMITING 12/22/21 12/22/22  Harvel Quale, MD  ?meloxicam (MOBIC) 15 MG tablet Take 1 tablet (15 mg total) by mouth  every morning. 08/10/21     ?ondansetron (ZOFRAN-ODT) 4 MG disintegrating tablet Dissolve 1 tablet in mouth every 6 hours as needed for nausea. 08/24/21     ?oxyCODONE (OXY IR/ROXICODONE) 5 MG immediate release tablet Take 1 tablet by mouth every 4-6 hours as needed for pain for 7 days. 08/24/21     ?rosuvastatin (CRESTOR) 5 MG tablet Take 1 tablet by mouth every evening 10/11/21     ?   ? ?Allergies    ?Sulfa antibiotics   ? ?Review of Systems   ?Review of Systems  ?Constitutional:  Negative for chills and fever.  ?Respiratory:  Negative for shortness of breath.   ?Cardiovascular:  Negative for chest pain.  ?Gastrointestinal:  Negative for abdominal pain.  ?Musculoskeletal:   ?     Knee pain  ?Neurological:  Negative for headaches.  ? ?Physical Exam ?Updated Vital Signs ?BP 139/80 (BP Location: Left Arm)   Pulse 77   Temp 98.2 ?F (36.8 ?C) (Oral)   Resp 20   Ht '5\' 2"'$  (1.575 m)   Wt 92.5 kg   SpO2 98%   BMI 37.31 kg/m?  ?Physical Exam ?Vitals and nursing note reviewed.  ?Constitutional:   ?   General: She is not in acute distress. ?   Appearance: She is not ill-appearing.  ?HENT:  ?   Head: Normocephalic and atraumatic.  ?  Nose: No congestion.  ?Eyes:  ?   Conjunctiva/sclera: Conjunctivae normal.  ?Cardiovascular:  ?   Rate and Rhythm: Normal rate and regular rhythm.  ?   Pulses: Normal pulses.  ?   Heart sounds:  ?  No friction rub.  ?Pulmonary:  ?   Effort: Pulmonary effort is normal.  ?Musculoskeletal:  ?   Comments: Focused exam of the right knee reveals obvious deformity with likely lateral dislocation of the patella, no overlying skin changes no tenting of the skin, she is able to move her toes and ankle, unable to bend the knee due to pain, neurovascularly intact, 2+ posterior tibial pulses.  ?Skin: ?   General: Skin is warm and dry.  ?Neurological:  ?   Mental Status: She is alert.  ?Psychiatric:     ?   Mood and Affect: Mood normal.  ? ? ?ED Results / Procedures / Treatments   ?Labs ?(all labs  ordered are listed, but only abnormal results are displayed) ?Labs Reviewed - No data to display ? ?EKG ?None ? ?Radiology ?No results found. ? ?Procedures ?Procedures  ? ? ?Medications Ordered in ED ?Medications  ?fentaNYL (SUBLIMAZE) injection 50 mcg (has no administration in time range)  ?diazepam (VALIUM) injection 5 mg (5 mg Intravenous Given 03/06/22 1030)  ? ? ?ED Course/ Medical Decision Making/ A&P ?  ?                        ?Medical Decision Making ?Amount and/or Complexity of Data Reviewed ?Radiology: ordered. ? ?Risk ?Prescription drug management. ? ? ?This patient presents to the ED for concern of right knee pain, this involves an extensive number of treatment options, and is a complaint that carries with it a high risk of complications and morbidity.  The differential diagnosis includes fracture, dislocation, compartment syndrome ? ? ? ?Additional history obtained: ? ?Additional history obtained from EMS patient was knocked on my dog with right knee dislocation ?External records from outside source obtained and reviewed including previous imaging, comorbidities, PCP notes ? ? ?Co morbidities that complicate the patient evaluation ? ?N/A ? ?Social Determinants of Health: ? ?N/A ? ? ? ?Lab Tests: ? ?I Ordered, and personally interpreted labs.  The pertinent results include: N/A ? ? ?Imaging Studies ordered: ? ?I ordered imaging studies including DG of right knee ?I independently visualized and interpreted imaging which showed sublux of the right patella.  Repeat imaging reveals reduction of subluxation of the patella. ?I agree with the radiologist interpretation ? ? ?Cardiac Monitoring: ? ?The patient was maintained on a cardiac monitor.  I personally viewed and interpreted the cardiac monitored which showed an underlying rhythm of: N/A ? ? ?Medicines ordered and prescription drug management: ? ?I ordered medication including Valium for pain ?I have reviewed the patients home medicines and have made  adjustments as needed ? ?Critical Interventions: ? ?Had obvious patella dislocation, recommends reduction, patient agreed this plan and she tolerated this procedure well. ? ? ?Reevaluation: ? ?Presents with right knee pain, has obvious deformity the right knee, likely patellar dislocation, patient is still in pain, will provide her with Valium, obtain imaging, and attempt to reduce. ? ?Patient is reassessed after Valium states she feels better but still have slight pain patient given additional fentanyl, imaging reveals lateral subluxation of the patella, with Dr. Kathrynn Humble dislocation was reduced successfully neurovascular fully intact after the procedure.Marland Kitchen  She was placed in a knee brace and imaging confirms successful reduction.  Patient is ready for discharge. ? ? ? ?Consultations Obtained: ? ?N/A ? ? ?Test Considered: ? ?N/A ? ? ? ?Rule out ?I have low suspicion for septic arthritis as patient skin exam was performed no erythematous, edematous, warm joints noted on exam, no new heart murmur heard on exam.  Low suspicion for fracture does not feel any significant findings. low suspicion for ligament or tendon damage as area was palpated no gross defects noted, she has full range of motion as well as 5/5 strength.  Low suspicion for compartment syndrome as area was palpated it was soft to the touch, neurovascular fully intact. ? ? ? ? ?Dispostion and problem list ? ?After consideration of the diagnostic results and the patients response to treatment, I feel that the patent would benefit from discharge. ? ?Patella dislocation-this was reduced, placed in a knee immobilizer, informed that she will be weightbearing as tolerated, follow-up with orthopedics for further evaluation and strict return precautions. ? ? ? ? ? ? ? ? ? ? ? ?Final Clinical Impression(s) / ED Diagnoses ?Final diagnoses:  ?None  ? ? ?Rx / DC Orders ?ED Discharge Orders   ? ? None  ? ?  ? ? ?  ?Marcello Fennel, PA-C ?03/06/22 1826 ? ?   ?Varney Biles, MD ?03/07/22 1106 ? ?

## 2022-03-06 NOTE — ED Triage Notes (Signed)
Patient brought in by [facility].  Patient knocked down by dog and has obvious deformity to right knee.  Patient has had 10 mg of morphine with EMS and '4mg'$  of zofran. ?

## 2022-03-07 ENCOUNTER — Other Ambulatory Visit (HOSPITAL_COMMUNITY): Payer: Self-pay

## 2022-03-08 ENCOUNTER — Other Ambulatory Visit (HOSPITAL_COMMUNITY): Payer: Self-pay

## 2022-03-08 DIAGNOSIS — M25571 Pain in right ankle and joints of right foot: Secondary | ICD-10-CM | POA: Diagnosis not present

## 2022-03-08 DIAGNOSIS — S83191A Other subluxation of right knee, initial encounter: Secondary | ICD-10-CM | POA: Diagnosis not present

## 2022-03-10 ENCOUNTER — Other Ambulatory Visit (HOSPITAL_COMMUNITY): Payer: Self-pay

## 2022-03-11 ENCOUNTER — Other Ambulatory Visit (HOSPITAL_COMMUNITY): Payer: Self-pay

## 2022-03-11 DIAGNOSIS — S8264XA Nondisplaced fracture of lateral malleolus of right fibula, initial encounter for closed fracture: Secondary | ICD-10-CM | POA: Diagnosis not present

## 2022-03-12 ENCOUNTER — Other Ambulatory Visit (HOSPITAL_COMMUNITY): Payer: Self-pay

## 2022-03-14 ENCOUNTER — Other Ambulatory Visit (HOSPITAL_COMMUNITY): Payer: Self-pay

## 2022-03-14 DIAGNOSIS — S8264XA Nondisplaced fracture of lateral malleolus of right fibula, initial encounter for closed fracture: Secondary | ICD-10-CM | POA: Diagnosis not present

## 2022-03-14 DIAGNOSIS — S83014D Lateral dislocation of right patella, subsequent encounter: Secondary | ICD-10-CM | POA: Diagnosis not present

## 2022-03-22 DIAGNOSIS — M25571 Pain in right ankle and joints of right foot: Secondary | ICD-10-CM | POA: Diagnosis not present

## 2022-03-22 DIAGNOSIS — S8264XA Nondisplaced fracture of lateral malleolus of right fibula, initial encounter for closed fracture: Secondary | ICD-10-CM | POA: Diagnosis not present

## 2022-03-24 ENCOUNTER — Other Ambulatory Visit (HOSPITAL_COMMUNITY): Payer: Self-pay

## 2022-03-31 DIAGNOSIS — M25571 Pain in right ankle and joints of right foot: Secondary | ICD-10-CM | POA: Diagnosis not present

## 2022-03-31 DIAGNOSIS — S83191A Other subluxation of right knee, initial encounter: Secondary | ICD-10-CM | POA: Diagnosis not present

## 2022-04-06 DIAGNOSIS — M25561 Pain in right knee: Secondary | ICD-10-CM | POA: Diagnosis not present

## 2022-04-07 DIAGNOSIS — M25571 Pain in right ankle and joints of right foot: Secondary | ICD-10-CM | POA: Diagnosis not present

## 2022-04-07 DIAGNOSIS — S8264XA Nondisplaced fracture of lateral malleolus of right fibula, initial encounter for closed fracture: Secondary | ICD-10-CM | POA: Diagnosis not present

## 2022-04-11 ENCOUNTER — Telehealth (INDEPENDENT_AMBULATORY_CARE_PROVIDER_SITE_OTHER): Payer: Self-pay

## 2022-04-11 NOTE — Telephone Encounter (Signed)
Mitzie please place on schedule for 04/25/2022 at 11:45 am.  We received a renewal for Saxenda authorization 04/11/2022. I advised the patient she has not been seen in over a year and she will need to come in for a visit so we can collect medical information that will be required to get authorized. We have an opening on 04/25/2022 at 11:45 am she wanted [place] to place her in that spot. I have made Mitzie aware to place on that date and at that time.

## 2022-04-12 ENCOUNTER — Other Ambulatory Visit (HOSPITAL_COMMUNITY): Payer: Self-pay

## 2022-04-12 MED ORDER — ROSUVASTATIN CALCIUM 5 MG PO TABS
ORAL_TABLET | ORAL | 1 refills | Status: DC
  Filled 2022-04-12: qty 90, 90d supply, fill #0

## 2022-04-14 ENCOUNTER — Other Ambulatory Visit (HOSPITAL_COMMUNITY): Payer: Self-pay

## 2022-04-15 ENCOUNTER — Other Ambulatory Visit (HOSPITAL_COMMUNITY): Payer: Self-pay

## 2022-04-15 MED ORDER — FLUOXETINE HCL 10 MG PO CAPS
ORAL_CAPSULE | ORAL | 1 refills | Status: DC
  Filled 2022-04-15: qty 30, 30d supply, fill #0
  Filled 2022-04-15: qty 60, 60d supply, fill #0
  Filled 2022-05-10: qty 30, 30d supply, fill #1

## 2022-04-15 MED ORDER — FLUOXETINE HCL 20 MG PO CAPS
ORAL_CAPSULE | ORAL | 0 refills | Status: DC
  Filled 2022-04-15: qty 90, 90d supply, fill #0

## 2022-04-18 ENCOUNTER — Other Ambulatory Visit (HOSPITAL_COMMUNITY): Payer: Self-pay

## 2022-04-18 DIAGNOSIS — E6609 Other obesity due to excess calories: Secondary | ICD-10-CM | POA: Diagnosis not present

## 2022-04-18 DIAGNOSIS — Z6838 Body mass index (BMI) 38.0-38.9, adult: Secondary | ICD-10-CM | POA: Diagnosis not present

## 2022-04-18 DIAGNOSIS — R030 Elevated blood-pressure reading, without diagnosis of hypertension: Secondary | ICD-10-CM | POA: Diagnosis not present

## 2022-04-18 DIAGNOSIS — F411 Generalized anxiety disorder: Secondary | ICD-10-CM | POA: Diagnosis not present

## 2022-04-18 DIAGNOSIS — R739 Hyperglycemia, unspecified: Secondary | ICD-10-CM | POA: Diagnosis not present

## 2022-04-18 MED ORDER — FLUOXETINE HCL 20 MG PO CAPS
20.0000 mg | ORAL_CAPSULE | Freq: Every day | ORAL | 1 refills | Status: DC
  Filled 2022-04-18 – 2022-07-11 (×2): qty 90, 90d supply, fill #0
  Filled 2022-10-05: qty 90, 90d supply, fill #1

## 2022-04-18 MED ORDER — SAXENDA 18 MG/3ML ~~LOC~~ SOPN: PEN_INJECTOR | SUBCUTANEOUS | 1 refills | Status: DC

## 2022-04-18 MED ORDER — FLUOXETINE HCL 10 MG PO CAPS
ORAL_CAPSULE | ORAL | 1 refills | Status: DC
  Filled 2022-04-18 – 2022-06-08 (×2): qty 90, 90d supply, fill #0
  Filled 2022-09-04: qty 90, 90d supply, fill #1

## 2022-04-18 MED ORDER — ROSUVASTATIN CALCIUM 5 MG PO TABS
5.0000 mg | ORAL_TABLET | Freq: Every evening | ORAL | 1 refills | Status: DC
  Filled 2022-04-18 – 2022-07-11 (×2): qty 90, 90d supply, fill #0
  Filled 2022-10-05: qty 90, 90d supply, fill #1

## 2022-04-20 DIAGNOSIS — M25561 Pain in right knee: Secondary | ICD-10-CM | POA: Diagnosis not present

## 2022-04-20 DIAGNOSIS — S83014D Lateral dislocation of right patella, subsequent encounter: Secondary | ICD-10-CM | POA: Diagnosis not present

## 2022-04-22 DIAGNOSIS — S8264XA Nondisplaced fracture of lateral malleolus of right fibula, initial encounter for closed fracture: Secondary | ICD-10-CM | POA: Diagnosis not present

## 2022-04-25 ENCOUNTER — Ambulatory Visit (INDEPENDENT_AMBULATORY_CARE_PROVIDER_SITE_OTHER): Payer: 59 | Admitting: Gastroenterology

## 2022-04-25 ENCOUNTER — Telehealth (INDEPENDENT_AMBULATORY_CARE_PROVIDER_SITE_OTHER): Payer: Self-pay

## 2022-04-29 ENCOUNTER — Other Ambulatory Visit (HOSPITAL_COMMUNITY): Payer: Self-pay

## 2022-04-29 MED ORDER — SAXENDA 18 MG/3ML ~~LOC~~ SOPN
PEN_INJECTOR | SUBCUTANEOUS | 0 refills | Status: DC
  Filled 2022-04-29: qty 3, 30d supply, fill #0

## 2022-05-04 ENCOUNTER — Other Ambulatory Visit (HOSPITAL_COMMUNITY): Payer: Self-pay

## 2022-05-04 ENCOUNTER — Encounter (HOSPITAL_COMMUNITY): Payer: Self-pay | Admitting: Pharmacist

## 2022-05-05 ENCOUNTER — Other Ambulatory Visit (HOSPITAL_COMMUNITY): Payer: Self-pay

## 2022-05-07 ENCOUNTER — Other Ambulatory Visit (HOSPITAL_COMMUNITY): Payer: Self-pay

## 2022-05-10 ENCOUNTER — Other Ambulatory Visit (HOSPITAL_COMMUNITY): Payer: Self-pay

## 2022-05-10 DIAGNOSIS — S8264XA Nondisplaced fracture of lateral malleolus of right fibula, initial encounter for closed fracture: Secondary | ICD-10-CM | POA: Diagnosis not present

## 2022-05-16 ENCOUNTER — Other Ambulatory Visit (HOSPITAL_COMMUNITY): Payer: Self-pay

## 2022-05-16 DIAGNOSIS — Z1389 Encounter for screening for other disorder: Secondary | ICD-10-CM | POA: Diagnosis not present

## 2022-05-16 DIAGNOSIS — R739 Hyperglycemia, unspecified: Secondary | ICD-10-CM | POA: Diagnosis not present

## 2022-05-16 DIAGNOSIS — F411 Generalized anxiety disorder: Secondary | ICD-10-CM | POA: Diagnosis not present

## 2022-05-16 DIAGNOSIS — Z6839 Body mass index (BMI) 39.0-39.9, adult: Secondary | ICD-10-CM | POA: Diagnosis not present

## 2022-05-16 DIAGNOSIS — R030 Elevated blood-pressure reading, without diagnosis of hypertension: Secondary | ICD-10-CM | POA: Diagnosis not present

## 2022-05-16 DIAGNOSIS — Z1331 Encounter for screening for depression: Secondary | ICD-10-CM | POA: Diagnosis not present

## 2022-05-16 MED ORDER — WEGOVY 0.5 MG/0.5ML ~~LOC~~ SOAJ
SUBCUTANEOUS | 0 refills | Status: DC
Start: 2022-05-27 — End: 2022-07-13
  Filled 2022-05-27 – 2022-07-13 (×2): qty 2, 28d supply, fill #0

## 2022-05-23 ENCOUNTER — Other Ambulatory Visit (HOSPITAL_COMMUNITY): Payer: Self-pay | Admitting: Family Medicine

## 2022-05-23 ENCOUNTER — Ambulatory Visit (HOSPITAL_COMMUNITY)
Admission: RE | Admit: 2022-05-23 | Discharge: 2022-05-23 | Disposition: A | Discharge status: Home / Self Care | Payer: 59 | Source: Ambulatory Visit | Attending: Family Medicine | Admitting: Family Medicine

## 2022-05-23 DIAGNOSIS — Z1231 Encounter for screening mammogram for malignant neoplasm of breast: Secondary | ICD-10-CM | POA: Diagnosis not present

## 2022-05-24 ENCOUNTER — Other Ambulatory Visit (HOSPITAL_COMMUNITY): Payer: Self-pay

## 2022-05-27 ENCOUNTER — Other Ambulatory Visit (HOSPITAL_COMMUNITY): Payer: Self-pay

## 2022-05-30 DIAGNOSIS — H52223 Regular astigmatism, bilateral: Secondary | ICD-10-CM | POA: Diagnosis not present

## 2022-06-02 ENCOUNTER — Other Ambulatory Visit (HOSPITAL_COMMUNITY): Payer: Self-pay

## 2022-06-02 MED ORDER — FLUOROMETHOLONE 0.1 % OP SUSP
OPHTHALMIC | 3 refills | Status: AC
  Filled 2022-06-02: qty 5, 13d supply, fill #0
  Filled 2022-10-19: qty 5, 13d supply, fill #1

## 2022-06-05 ENCOUNTER — Other Ambulatory Visit (INDEPENDENT_AMBULATORY_CARE_PROVIDER_SITE_OTHER): Payer: Self-pay | Admitting: Gastroenterology

## 2022-06-06 ENCOUNTER — Other Ambulatory Visit (HOSPITAL_COMMUNITY): Payer: Self-pay

## 2022-06-07 ENCOUNTER — Other Ambulatory Visit (INDEPENDENT_AMBULATORY_CARE_PROVIDER_SITE_OTHER): Payer: Self-pay

## 2022-06-07 DIAGNOSIS — Z86010 Personal history of colonic polyps: Secondary | ICD-10-CM

## 2022-06-07 DIAGNOSIS — Z800 Family history of malignant neoplasm of digestive organs: Secondary | ICD-10-CM

## 2022-06-08 ENCOUNTER — Other Ambulatory Visit (HOSPITAL_COMMUNITY): Payer: Self-pay

## 2022-06-08 ENCOUNTER — Other Ambulatory Visit (INDEPENDENT_AMBULATORY_CARE_PROVIDER_SITE_OTHER): Payer: Self-pay

## 2022-06-14 ENCOUNTER — Other Ambulatory Visit (HOSPITAL_COMMUNITY): Payer: Self-pay

## 2022-06-14 DIAGNOSIS — R739 Hyperglycemia, unspecified: Secondary | ICD-10-CM | POA: Diagnosis not present

## 2022-06-14 DIAGNOSIS — Z6838 Body mass index (BMI) 38.0-38.9, adult: Secondary | ICD-10-CM | POA: Diagnosis not present

## 2022-06-14 DIAGNOSIS — F411 Generalized anxiety disorder: Secondary | ICD-10-CM | POA: Diagnosis not present

## 2022-06-14 DIAGNOSIS — R030 Elevated blood-pressure reading, without diagnosis of hypertension: Secondary | ICD-10-CM | POA: Diagnosis not present

## 2022-06-14 MED ORDER — SAXENDA 18 MG/3ML ~~LOC~~ SOPN
PEN_INJECTOR | SUBCUTANEOUS | 0 refills | Status: DC
  Filled 2022-06-14: qty 3, 7d supply, fill #0

## 2022-06-15 ENCOUNTER — Other Ambulatory Visit (HOSPITAL_COMMUNITY): Payer: Self-pay

## 2022-06-15 MED ORDER — SAXENDA 18 MG/3ML ~~LOC~~ SOPN
PEN_INJECTOR | SUBCUTANEOUS | 0 refills | Status: DC
  Filled 2022-06-15: qty 3, 30d supply, fill #0

## 2022-06-16 ENCOUNTER — Other Ambulatory Visit (HOSPITAL_COMMUNITY): Payer: Self-pay

## 2022-06-16 MED ORDER — LIRAGLUTIDE -WEIGHT MANAGEMENT 18 MG/3ML ~~LOC~~ SOPN
PEN_INJECTOR | SUBCUTANEOUS | 3 refills | Status: DC
  Filled 2022-06-16: qty 15, 30d supply, fill #0
  Filled 2022-07-11: qty 15, 30d supply, fill #1

## 2022-06-17 ENCOUNTER — Other Ambulatory Visit (HOSPITAL_COMMUNITY): Payer: Self-pay

## 2022-07-05 ENCOUNTER — Other Ambulatory Visit (HOSPITAL_COMMUNITY)
Admission: RE | Admit: 2022-07-05 | Discharge: 2022-07-05 | Disposition: A | Discharge status: Home / Self Care | Payer: 59 | Source: Ambulatory Visit | Attending: Adult Health | Admitting: Adult Health

## 2022-07-05 ENCOUNTER — Ambulatory Visit (INDEPENDENT_AMBULATORY_CARE_PROVIDER_SITE_OTHER): Payer: 59 | Admitting: Adult Health

## 2022-07-05 ENCOUNTER — Encounter: Payer: Self-pay | Admitting: Adult Health

## 2022-07-05 VITALS — BP 108/63 | HR 76 | Ht 62.5 in | Wt 212.5 lb

## 2022-07-05 DIAGNOSIS — Z975 Presence of (intrauterine) contraceptive device: Secondary | ICD-10-CM | POA: Diagnosis not present

## 2022-07-05 DIAGNOSIS — Z1329 Encounter for screening for other suspected endocrine disorder: Secondary | ICD-10-CM

## 2022-07-05 DIAGNOSIS — Z13220 Encounter for screening for lipoid disorders: Secondary | ICD-10-CM | POA: Diagnosis not present

## 2022-07-05 DIAGNOSIS — Z01419 Encounter for gynecological examination (general) (routine) without abnormal findings: Secondary | ICD-10-CM | POA: Diagnosis not present

## 2022-07-05 DIAGNOSIS — L659 Nonscarring hair loss, unspecified: Secondary | ICD-10-CM | POA: Diagnosis not present

## 2022-07-05 NOTE — Progress Notes (Signed)
Patient ID: Miranda Williams, female   DOB: 26-Jul-1971, 51 y.o.   MRN: [id number] History of Present Illness: Miranda Williams is a 51 year old white female,divorced, G1P1 in for well woman gyn exam and pap. She has liletta. PCP is Doyce Loose PA.   Current Medications, Allergies, Past Medical History, Past Surgical History, Family History and Social History were reviewed in [employer] record.     Review of Systems: Patient denies any headaches, hearing loss, fatigue, blurred vision, shortness of breath, chest pain, abdominal pain, problems with bowel movements, urination, or intercourse. No joint pain or mood swings. Her dog ran into her in may and dislocated right knee and she had a fracture. No periods with IUD Hair thinning in front and has increased hair on chin  Physical Exam:BP 108/63 (BP Location: Left Arm, Patient Position: Sitting, Cuff Size: Large)   Pulse 76   Ht 5' 2.5" (1.588 m)   Wt 212 lb 8 oz (96.4 kg)   BMI 38.25 kg/m   General:  Well developed, well nourished, no acute distress Skin:  Warm and dry Neck:  Midline trachea, normal thyroid, good ROM, no lymphadenopathy Lungs; Clear to auscultation bilaterally Breast:  No dominant palpable mass, retraction, or nipple discharge Cardiovascular: Regular rate and rhythm Abdomen:  Soft, non tender, no hepatosplenomegaly Pelvic:  External genitalia is normal in appearance, no lesions.  The vagina is normal in appearance. Urethra has no lesions or masses. The cervix is smooth, +IUD strings at os, pap with HR HPV genotyping performed, cervix is friable with EC os.  Uterus is felt to be normal size, shape, and contour.  No adnexal masses or tenderness noted.Bladder is non tender, no masses felt. Rectal: Deferred,at pt request  Extremities/musculoskeletal:  No swelling or varicosities noted, no clubbing or cyanosis Psych:  No mood changes, alert and cooperative,seems happy AA is 1 Fall risk is moderate    07/05/2022     3:20 PM 06/02/2021    3:28 PM 06/01/2020    3:42 PM  Depression screen PHQ 2/9  Decreased Interest 0 0 0  Down, Depressed, Hopeless 0 1 0  PHQ - 2 Score 0 1 0  Altered sleeping '1 1 1  '$ Tired, decreased energy 1 1 0  Change in appetite '3 3 3  '$ Feeling bad or failure about yourself  0 1 3  Trouble concentrating 0 1 0  Moving slowly or fidgety/restless 0 0 0  Suicidal thoughts 0 0 0  PHQ-9 Score '5 8 7  '$ Difficult doing work/chores   Not difficult at all   On prozac    07/05/2022    3:21 PM 06/02/2021    3:28 PM 06/01/2020    3:43 PM  GAD 7 : Generalized Anxiety Score  Nervous, Anxious, on Edge 0 1 0  Control/stop worrying 0 0 0  Worry too much - different things 0 0 0  Trouble relaxing 0 0 0  Restless 0 0 0  Easily annoyed or irritable 0 0 0  Afraid - awful might happen 0 0 0  Total GAD 7 Score 0 1 0  Anxiety Difficulty   Not difficult at all      Upstream - 07/05/22 1519       Pregnancy Intention Screening   Does the patient want to become pregnant in the next year? No    Does the patient's partner want to become pregnant in the next year? No    Would the patient like to discuss contraceptive  options today? No      Contraception Wrap Up   Current Method IUD or IUS    End Method IUD or IUS             Examination chaperoned by Levy Pupa LPN  Impression and Plan: 1. Encounter for gynecological examination with Papanicolaou smear of cervix Pap sent Pap in 1 year if normal Physical in 1 year Will check labs  - Cytology - PAP( [facility]) - CBC - Comprehensive metabolic panel - TSH - Lipid panel She had normal mammogram 05/23/22 She had colonoscopy scheduled for 08/12/22.  2. IUD (intrauterine device) in place Liletta placed 12/31/14  3. Screening for thyroid disorder - TSH  4. Screening cholesterol level - Lipid panel  5. Hair thinning She wants to consider spirolactone, will talk when labs back  - TSH

## 2022-07-06 LAB — CBC
Hematocrit: 38.9 % (ref 34.0–46.6)
Hemoglobin: 13.1 g/dL (ref 11.1–15.9)
MCH: 29 pg (ref 26.6–33.0)
MCHC: 33.7 g/dL (ref 31.5–35.7)
MCV: 86 fL (ref 79–97)
Platelets: 288 10*3/uL (ref 150–450)
RBC: 4.51 x10E6/uL (ref 3.77–5.28)
RDW: 12.7 % (ref 11.7–15.4)
WBC: 7 10*3/uL (ref 3.4–10.8)

## 2022-07-06 LAB — LIPID PANEL
Chol/HDL Ratio: 3.4 ratio (ref 0.0–4.4)
Cholesterol, Total: 149 mg/dL (ref 100–199)
HDL: 44 mg/dL (ref >39)
LDL Chol Calc (NIH): 78 mg/dL (ref 0–99)
Triglycerides: 155 mg/dL — ABNORMAL HIGH (ref 0–149)
VLDL Cholesterol Cal: 27 mg/dL (ref 5–40)

## 2022-07-06 LAB — COMPREHENSIVE METABOLIC PANEL
ALT: 21 IU/L (ref 0–32)
AST: 18 IU/L (ref 0–40)
Albumin/Globulin Ratio: 1.9 (ref 1.2–2.2)
Albumin: 4.6 g/dL (ref 3.8–4.9)
Alkaline Phosphatase: 101 IU/L (ref 44–121)
BUN/Creatinine Ratio: 16 (ref 9–23)
BUN: 12 mg/dL (ref 6–24)
Bilirubin Total: 0.3 mg/dL (ref 0.0–1.2)
CO2: 23 mmol/L (ref 20–29)
Calcium: 9.6 mg/dL (ref 8.7–10.2)
Chloride: 101 mmol/L (ref 96–106)
Creatinine, Ser: 0.74 mg/dL (ref 0.57–1.00)
Globulin, Total: 2.4 g/dL (ref 1.5–4.5)
Glucose: 94 mg/dL (ref 70–99)
Potassium: 4.6 mmol/L (ref 3.5–5.2)
Sodium: 138 mmol/L (ref 134–144)
Total Protein: 7 g/dL (ref 6.0–8.5)
eGFR: 97 mL/min/{1.73_m2} (ref >59)

## 2022-07-06 LAB — TSH: TSH: 1.58 u[IU]/mL (ref 0.450–4.500)

## 2022-07-11 ENCOUNTER — Other Ambulatory Visit: Payer: Self-pay | Admitting: Adult Health

## 2022-07-11 ENCOUNTER — Other Ambulatory Visit (HOSPITAL_COMMUNITY): Payer: Self-pay

## 2022-07-11 LAB — CYTOLOGY - PAP
Comment: NEGATIVE
Diagnosis: NEGATIVE
High risk HPV: NEGATIVE

## 2022-07-11 MED ORDER — SPIRONOLACTONE 50 MG PO TABS
50.0000 mg | ORAL_TABLET | Freq: Every day | ORAL | 6 refills | Status: DC
  Filled 2022-07-11: qty 30, 30d supply, fill #0
  Filled 2022-07-12 – 2022-08-09 (×2): qty 30, 30d supply, fill #1
  Filled 2022-09-04: qty 30, 30d supply, fill #2
  Filled 2022-10-05: qty 30, 30d supply, fill #3
  Filled 2022-11-03: qty 30, 30d supply, fill #4
  Filled 2022-11-29: qty 30, 30d supply, fill #5
  Filled 2022-12-29: qty 30, 30d supply, fill #6

## 2022-07-11 NOTE — Progress Notes (Signed)
R aldactone 50 mg

## 2022-07-12 ENCOUNTER — Other Ambulatory Visit (HOSPITAL_COMMUNITY): Payer: Self-pay

## 2022-07-12 DIAGNOSIS — S8264XA Nondisplaced fracture of lateral malleolus of right fibula, initial encounter for closed fracture: Secondary | ICD-10-CM | POA: Diagnosis not present

## 2022-07-12 MED ORDER — MELOXICAM 7.5 MG PO TABS
7.5000 mg | ORAL_TABLET | Freq: Two times a day (BID) | ORAL | 0 refills | Status: DC
  Filled 2022-07-12: qty 60, 30d supply, fill #0

## 2022-07-13 ENCOUNTER — Encounter (INDEPENDENT_AMBULATORY_CARE_PROVIDER_SITE_OTHER): Payer: Self-pay

## 2022-07-13 ENCOUNTER — Other Ambulatory Visit (HOSPITAL_COMMUNITY): Payer: Self-pay

## 2022-07-13 ENCOUNTER — Telehealth (INDEPENDENT_AMBULATORY_CARE_PROVIDER_SITE_OTHER): Payer: Self-pay

## 2022-07-13 MED ORDER — WEGOVY 0.5 MG/0.5ML ~~LOC~~ SOAJ
0.5000 mg | Freq: Every day | SUBCUTANEOUS | 0 refills | Status: DC
  Filled 2022-07-13 – 2022-08-10 (×2): qty 2, 28d supply, fill #0

## 2022-07-13 MED ORDER — PEG 3350-KCL-NA BICARB-NACL 420 G PO SOLR
4000.0000 mL | ORAL | 0 refills | Status: DC
  Filled 2022-07-13: qty 4000, 1d supply, fill #0

## 2022-07-13 MED ORDER — MELOXICAM 7.5 MG PO TABS
7.5000 mg | ORAL_TABLET | Freq: Two times a day (BID) | ORAL | 0 refills | Status: DC
  Filled 2022-07-13: qty 60, 30d supply, fill #0

## 2022-07-13 NOTE — Telephone Encounter (Signed)
Referring MD/PCP: Denny Levy  Procedure: Tcs  Reason/Indication:  hx of colon polyps, fam hx of colon ca  Has patient had this procedure before?  yes  If so, when, by whom and where? 04/2019   Is there a family history of colon cancer?  yes  Who?  What age when diagnosed? Mother & Father   Is patient diabetic? If yes, Type 1 or Type 2   yes type 2      Does patient have prosthetic heart valve or mechanical valve?  No  Do you have a pacemaker/defibrillator?  No  Has patient ever had endocarditis/atrial fibrillation? No  Does patient use oxygen? No  Has patient had joint replacement within last 12 months?  No  Is patient constipated or do they take laxatives? sometimes  Does patient have a history of alcohol/drug use?  No  Have you had a stroke/heart attack last 6 mths? yes  Do you take medicine for weight loss?  No  For female patients,: have you had a hysterectomy No                      are you post menopausal Yes                      do you still have your menstrual cycle No  Is patient on blood thinner such as Coumadin, Plavix and/or Aspirin? No  Medications: Saxenda 2.4 mg daily, Fluoxetine 30 mg daily, rosuvastatin 5 mg daily  Allergies: Sulfa  Medication Adjustment per Dr Jenetta Downer None  Procedure date & time: 08/12/22 at 7:30

## 2022-07-13 NOTE — Telephone Encounter (Signed)
Miranda Williams, CMA  ?

## 2022-07-16 ENCOUNTER — Other Ambulatory Visit (HOSPITAL_COMMUNITY): Payer: Self-pay

## 2022-07-19 ENCOUNTER — Other Ambulatory Visit (HOSPITAL_COMMUNITY): Payer: Self-pay

## 2022-07-25 ENCOUNTER — Other Ambulatory Visit (HOSPITAL_COMMUNITY): Payer: Self-pay

## 2022-07-27 ENCOUNTER — Other Ambulatory Visit (HOSPITAL_COMMUNITY): Payer: Self-pay

## 2022-07-28 ENCOUNTER — Other Ambulatory Visit (INDEPENDENT_AMBULATORY_CARE_PROVIDER_SITE_OTHER): Payer: Self-pay

## 2022-07-28 ENCOUNTER — Other Ambulatory Visit (HOSPITAL_COMMUNITY): Payer: Self-pay

## 2022-07-28 DIAGNOSIS — I10 Essential (primary) hypertension: Secondary | ICD-10-CM

## 2022-07-28 DIAGNOSIS — Z01812 Encounter for preprocedural laboratory examination: Secondary | ICD-10-CM

## 2022-08-01 ENCOUNTER — Other Ambulatory Visit (HOSPITAL_COMMUNITY): Payer: Self-pay

## 2022-08-04 ENCOUNTER — Other Ambulatory Visit (HOSPITAL_COMMUNITY): Payer: Self-pay

## 2022-08-10 ENCOUNTER — Other Ambulatory Visit (HOSPITAL_COMMUNITY)
Admission: RE | Admit: 2022-08-10 | Discharge: 2022-08-10 | Disposition: A | Discharge status: Home / Self Care | Payer: 59 | Source: Ambulatory Visit | Attending: Gastroenterology | Admitting: Gastroenterology

## 2022-08-10 ENCOUNTER — Other Ambulatory Visit (HOSPITAL_COMMUNITY): Payer: Self-pay

## 2022-08-10 DIAGNOSIS — L820 Inflamed seborrheic keratosis: Secondary | ICD-10-CM | POA: Diagnosis not present

## 2022-08-10 DIAGNOSIS — L573 Poikiloderma of Civatte: Secondary | ICD-10-CM | POA: Diagnosis not present

## 2022-08-10 DIAGNOSIS — I10 Essential (primary) hypertension: Secondary | ICD-10-CM | POA: Diagnosis not present

## 2022-08-10 DIAGNOSIS — Z01812 Encounter for preprocedural laboratory examination: Secondary | ICD-10-CM | POA: Diagnosis not present

## 2022-08-10 DIAGNOSIS — D225 Melanocytic nevi of trunk: Secondary | ICD-10-CM | POA: Diagnosis not present

## 2022-08-10 DIAGNOSIS — L821 Other seborrheic keratosis: Secondary | ICD-10-CM | POA: Diagnosis not present

## 2022-08-10 LAB — BASIC METABOLIC PANEL
Anion gap: 8 (ref 5–15)
BUN: 19 mg/dL (ref 6–20)
CO2: 26 mmol/L (ref 22–32)
Calcium: 9.1 mg/dL (ref 8.9–10.3)
Chloride: 105 mmol/L (ref 98–111)
Creatinine, Ser: 0.67 mg/dL (ref 0.44–1.00)
GFR, Estimated: >60 mL/min (ref >60)
Glucose, Bld: 162 mg/dL — ABNORMAL HIGH (ref 70–99)
Potassium: 4 mmol/L (ref 3.5–5.1)
Sodium: 139 mmol/L (ref 135–145)

## 2022-08-10 LAB — PREGNANCY, URINE: Preg Test, Ur: NEGATIVE

## 2022-08-11 ENCOUNTER — Other Ambulatory Visit (HOSPITAL_COMMUNITY): Payer: Self-pay

## 2022-08-11 DIAGNOSIS — F411 Generalized anxiety disorder: Secondary | ICD-10-CM | POA: Diagnosis not present

## 2022-08-11 DIAGNOSIS — R739 Hyperglycemia, unspecified: Secondary | ICD-10-CM | POA: Diagnosis not present

## 2022-08-11 DIAGNOSIS — R030 Elevated blood-pressure reading, without diagnosis of hypertension: Secondary | ICD-10-CM | POA: Diagnosis not present

## 2022-08-11 DIAGNOSIS — Z6837 Body mass index (BMI) 37.0-37.9, adult: Secondary | ICD-10-CM | POA: Diagnosis not present

## 2022-08-12 ENCOUNTER — Ambulatory Visit (HOSPITAL_COMMUNITY): Payer: 59 | Admitting: Anesthesiology

## 2022-08-12 ENCOUNTER — Encounter (HOSPITAL_COMMUNITY)
Admission: RE | Disposition: A | Discharge status: Home / Self Care | Payer: Self-pay | Source: Ambulatory Visit | Attending: Gastroenterology

## 2022-08-12 ENCOUNTER — Ambulatory Visit (HOSPITAL_COMMUNITY)
Admission: RE | Admit: 2022-08-12 | Discharge: 2022-08-12 | Disposition: A | Discharge status: Home / Self Care | Payer: 59 | Source: Ambulatory Visit | Attending: Gastroenterology | Admitting: Gastroenterology

## 2022-08-12 ENCOUNTER — Ambulatory Visit (HOSPITAL_BASED_OUTPATIENT_CLINIC_OR_DEPARTMENT_OTHER): Payer: 59 | Admitting: Anesthesiology

## 2022-08-12 ENCOUNTER — Other Ambulatory Visit: Payer: Self-pay

## 2022-08-12 ENCOUNTER — Other Ambulatory Visit (HOSPITAL_COMMUNITY): Payer: Self-pay

## 2022-08-12 ENCOUNTER — Encounter (HOSPITAL_COMMUNITY): Payer: Self-pay | Admitting: Gastroenterology

## 2022-08-12 DIAGNOSIS — Z86010 Personal history of colonic polyps: Secondary | ICD-10-CM

## 2022-08-12 DIAGNOSIS — I10 Essential (primary) hypertension: Secondary | ICD-10-CM | POA: Diagnosis not present

## 2022-08-12 DIAGNOSIS — D123 Benign neoplasm of transverse colon: Secondary | ICD-10-CM | POA: Insufficient documentation

## 2022-08-12 DIAGNOSIS — K5730 Diverticulosis of large intestine without perforation or abscess without bleeding: Secondary | ICD-10-CM

## 2022-08-12 DIAGNOSIS — D175 Benign lipomatous neoplasm of intra-abdominal organs: Secondary | ICD-10-CM | POA: Diagnosis not present

## 2022-08-12 DIAGNOSIS — K648 Other hemorrhoids: Secondary | ICD-10-CM

## 2022-08-12 DIAGNOSIS — Z7984 Long term (current) use of oral hypoglycemic drugs: Secondary | ICD-10-CM | POA: Diagnosis not present

## 2022-08-12 DIAGNOSIS — K635 Polyp of colon: Secondary | ICD-10-CM

## 2022-08-12 DIAGNOSIS — E119 Type 2 diabetes mellitus without complications: Secondary | ICD-10-CM | POA: Diagnosis not present

## 2022-08-12 DIAGNOSIS — D1779 Benign lipomatous neoplasm of other sites: Secondary | ICD-10-CM | POA: Insufficient documentation

## 2022-08-12 DIAGNOSIS — Z1211 Encounter for screening for malignant neoplasm of colon: Secondary | ICD-10-CM

## 2022-08-12 DIAGNOSIS — Z09 Encounter for follow-up examination after completed treatment for conditions other than malignant neoplasm: Secondary | ICD-10-CM | POA: Diagnosis not present

## 2022-08-12 DIAGNOSIS — D120 Benign neoplasm of cecum: Secondary | ICD-10-CM | POA: Insufficient documentation

## 2022-08-12 DIAGNOSIS — D122 Benign neoplasm of ascending colon: Secondary | ICD-10-CM | POA: Insufficient documentation

## 2022-08-12 DIAGNOSIS — Z800 Family history of malignant neoplasm of digestive organs: Secondary | ICD-10-CM | POA: Diagnosis not present

## 2022-08-12 DIAGNOSIS — K6389 Other specified diseases of intestine: Secondary | ICD-10-CM | POA: Diagnosis not present

## 2022-08-12 HISTORY — PX: COLONOSCOPY WITH PROPOFOL: SHX5780

## 2022-08-12 HISTORY — PX: POLYPECTOMY: SHX5525

## 2022-08-12 HISTORY — PX: BIOPSY: SHX5522

## 2022-08-12 LAB — GLUCOSE, CAPILLARY: Glucose-Capillary: 150 mg/dL — ABNORMAL HIGH (ref 70–99)

## 2022-08-12 LAB — HM COLONOSCOPY

## 2022-08-12 SURGERY — COLONOSCOPY WITH PROPOFOL
Anesthesia: General

## 2022-08-12 MED ORDER — LIDOCAINE HCL (CARDIAC) PF 100 MG/5ML IV SOSY
PREFILLED_SYRINGE | INTRAVENOUS | Status: DC | PRN
  Administered 2022-08-12: 50 mg via INTRATRACHEAL

## 2022-08-12 MED ORDER — LACTATED RINGERS IV SOLN: INTRAVENOUS | Status: DC

## 2022-08-12 MED ORDER — SAXENDA 18 MG/3ML ~~LOC~~ SOPN
PEN_INJECTOR | SUBCUTANEOUS | 3 refills | Status: DC
  Filled 2022-08-12: qty 15, 10d supply, fill #0

## 2022-08-12 MED ORDER — PROPOFOL 10 MG/ML IV BOLUS
INTRAVENOUS | Status: DC | PRN
  Administered 2022-08-12: 130 mg via INTRAVENOUS

## 2022-08-12 MED ORDER — PHENYLEPHRINE 80 MCG/ML (10ML) SYRINGE FOR IV PUSH (FOR BLOOD PRESSURE SUPPORT)
PREFILLED_SYRINGE | INTRAVENOUS | Status: DC | PRN
  Administered 2022-08-12: 160 ug via INTRAVENOUS

## 2022-08-12 MED ORDER — PROPOFOL 500 MG/50ML IV EMUL
INTRAVENOUS | Status: DC | PRN
  Administered 2022-08-12: 200 ug/kg/min via INTRAVENOUS

## 2022-08-12 NOTE — H&P (Signed)
Miranda Williams is an 51 y.o. female.   Chief Complaint: History of colon polyps and family history of colon cancer HPI: 51 year old female with past medical history of diabetes, hyperlipidemia, hypertension, coming for history of colon polyps and family history of colon cancer.  The patient denies having any nausea, vomiting, fever, chills, hematochezia, melena, hematemesis, abdominal distention, abdominal pain, diarrhea, jaundice, pruritus or weight loss.  Last Colonoscopy: 04/15/2019, 2 polyps were removed in the cecum with small size (both were sessile serrated polyps), diverticulosis  FHx: two aunts had liver cirrhosis due to NASH , neg for any gastrointestinal/liver disease, Her father was diagnosed with rectal adenocarcinoma at age 57 and died at 44 of cardiac disease.  Mother was diagnosed with stage IV CRC at age 73, no thyroid cancer   Past Medical History:  Diagnosis Date   Breast disorder    Diabetes mellitus without complication ([place])    Dystrophic radiologic calcification 03/05/2014   Right breast; removed 02/2013; benign   Gallstones    Hyperlipidemia    Hypertension    Vaginal Pap smear, abnormal    +HPV    Past Surgical History:  Procedure Laterality Date   CHOLECYSTECTOMY     COLONOSCOPY N/A 04/15/2019   Procedure: COLONOSCOPY;  Surgeon: Rogene Houston, MD;  [facility];  Service: Endoscopy;  Laterality: N/A;  1030   NASAL SINUS SURGERY     removal of benign tumor   POLYPECTOMY  04/15/2019   Procedure: POLYPECTOMY;  Surgeon: Rogene Houston, MD;  Location: [facility];  Service: Endoscopy;;  colon    Family History  Problem Relation Age of Onset   Cancer Mother        liver   Cancer Father        colon cancer   Social History:  reports that she has never smoked. She has never used smokeless tobacco. She reports current alcohol use. She reports that she does not use drugs.  Allergies:  Allergies  Allergen Reactions   Sulfa Antibiotics  Swelling, Rash and Other (See Comments)    fever    Medications Prior to Admission  Medication Sig Dispense Refill   fluorometholone (FML) 0.1 % ophthalmic suspension Place 1 drop into both eyes 3 times a day (Patient taking differently: Place 1 drop into both eyes as needed (allergies).) 5 mL 3   FLUoxetine (PROZAC) 10 MG capsule Take 1 capsule by mouth daily with 20 mg capsule for a total of 30 mg per day 90 capsule 1   FLUoxetine (PROZAC) 20 MG capsule Take 1 capsule (20 mg total) by mouth daily. 90 capsule 1   fluticasone (FLONASE) 50 MCG/ACT nasal spray Place 2 sprays into both nostrils daily as needed for allergies.     levonorgestrel (MIRENA) 20 MCG/24HR IUD 1 each by Intrauterine route once.     rosuvastatin (CRESTOR) 5 MG tablet Take 1 tablet (5 mg total) by mouth every evening. 90 tablet 1   spironolactone (ALDACTONE) 50 MG tablet Take 1 tablet (50 mg total) by mouth daily. 30 tablet 6   Liraglutide -Weight Management (SAXENDA) 18 MG/3ML SOPN Inject 2.4 mg into the skin daily.     meloxicam (MOBIC) 7.5 MG tablet Take 1 tablet (7.5 mg total) by mouth 2 (two) times daily for 2 weeks and then as needed (Patient not taking: Reported on 08/08/2022) 60 tablet 0   polyethylene glycol-electrolytes (TRILYTE) 420 g solution Take 4,000 mLs by mouth as directed. 4000 mL 0  Semaglutide-Weight Management (WEGOVY) 0.5 MG/0.5ML SOAJ Inject 0.5 mg into the skin daily. 2 mL 0    Results for orders placed or performed during the hospital encounter of 08/10/22 (from the past 48 hour(s))  Pregnancy, urine     Status: None   Collection Time: 08/10/22  8:19 AM  Result Value Ref Range   Preg Test, Ur NEGATIVE NEGATIVE    Comment:        THE SENSITIVITY OF THIS METHODOLOGY IS >20 mIU/mL. Performed at [facility], [address], [place], [place] [address]   Basic Metabolic Panel (BMET)     Status: Abnormal   Collection Time: 08/10/22  8:19 AM  Result Value Ref Range   Sodium 139 135 - 145 mmol/L    Potassium 4.0 3.5 - 5.1 mmol/L   Chloride 105 98 - 111 mmol/L   CO2 26 22 - 32 mmol/L   Glucose, Bld 162 (H) 70 - 99 mg/dL    Comment: Glucose reference range applies only to samples taken after fasting for at least 8 hours.   BUN 19 6 - 20 mg/dL   Creatinine, Ser 0.67 0.44 - 1.00 mg/dL   Calcium 9.1 8.9 - 10.3 mg/dL   GFR, Estimated >60 >60 mL/min    Comment: (NOTE) Calculated using the CKD-EPI Creatinine Equation (2021)    Anion gap 8 5 - 15    Comment: Performed at [facility], [address] [place], [place] [address]   No results found.  Review of Systems  All other systems reviewed and are negative.   Blood pressure (!) 117/58, pulse 88, temperature 97.9 F (36.6 C), temperature source Oral, resp. rate 20, SpO2 97 %. Physical Exam  GENERAL: The patient is AO x3, in no acute distress. HEENT: Head is normocephalic and atraumatic. EOMI are intact. Mouth is well hydrated and without lesions. NECK: Supple. No masses LUNGS: Clear to auscultation. No presence of rhonchi/wheezing/rales. Adequate chest expansion HEART: RRR, normal s1 and s2. ABDOMEN: Soft, nontender, no guarding, no peritoneal signs, and nondistended. BS +. No masses. EXTREMITIES: Without any cyanosis, clubbing, rash, lesions or edema. NEUROLOGIC: AOx3, no focal motor deficit. SKIN: no jaundice, no rashes  Assessment/Plan 51 year old female with past medical history of diabetes, hyperlipidemia, hypertension, coming for history of colon polyps and family history of colon cancer.  We will proceed with colonoscopy -high risk for colorectal cancer.  Harvel Quale, MD 08/12/2022, 7:44 AM

## 2022-08-12 NOTE — Discharge Instructions (Addendum)
You are being discharged to home.  Resume your previous diet.  We are waiting for your pathology results.  Your physician has recommended a repeat colonoscopy for surveillance based on pathology results.  

## 2022-08-12 NOTE — Op Note (Signed)
[facility] Patient Name: Miranda Williams Procedure Date: 08/12/2022 8:19 AM MRN: [id number] Date of Birth: Jul 17, 1971 Attending MD: Maylon Peppers ,  CSN: [id number] Age: 52 Admit Type: Outpatient Procedure:                Colonoscopy Indications:              Screening in patient at increased risk: Colorectal                            cancer in mother 83 or older, Screening in patient                            at increased risk: Colorectal cancer in father                            before age 46, High risk colon cancer surveillance:                            Personal history of sessile serrated colon polyp                            (less than 10 mm in size) with no dysplasia Providers:                Maylon Peppers, Tammy Vaught, RN, Kristine L.                            Risa Grill, Technician, Bethel Born,                            [employer] officer Referring MD:              Medicines:                Monitored Anesthesia Care Complications:            No immediate complications. Estimated Blood Loss:     Estimated blood loss: none. Procedure:                Pre-Anesthesia Assessment:                           - Prior to the procedure, a History and Physical                            was performed, and patient medications, allergies                            and sensitivities were reviewed. The patient's                            tolerance of previous anesthesia was reviewed.                           - The risks and benefits of the procedure and the  sedation options and risks were discussed with the                            patient. All questions were answered and informed                            consent was obtained.                           - ASA Grade Assessment: II - A patient with mild                            systemic disease.                           After obtaining informed consent, the colonoscope                             was passed under direct vision. Throughout the                            procedure, the patient's blood pressure, pulse, and                            oxygen saturations were monitored continuously. The                            [id number]-HQ190L ([id number]) scope was introduced through                            the anus and advanced to the the cecum, identified                            by appendiceal orifice and ileocecal valve. The                            colonoscopy was performed without difficulty. The                            patient tolerated the procedure well. The quality                            of the bowel preparation was good. Scope In: 8:32:17 AM Scope Out: 9:03:21 AM Scope Withdrawal Time: 0 hours 22 minutes 16 seconds  Total Procedure Duration: 0 hours 31 minutes 4 seconds  Findings:      The perianal and digital rectal examinations were normal.      A 1 mm polyp was found in the cecum. The polyp was sessile. The polyp       was removed with a cold biopsy forceps. Resection and retrieval were       complete.      There was a medium-sized lipoma, in the proximal ascending colon.      Two sessile polyps were found in the transverse colon and ascending       colon. The polyps  were 3 to 5 mm in size. These polyps were removed with       a cold snare. Resection and retrieval were complete.      A single small-mouthed diverticulum was found in the sigmoid colon.      Non-bleeding internal hemorrhoids were found during retroflexion. The       hemorrhoids were small. Impression:               - One 1 mm polyp in the cecum, removed with a cold                            biopsy forceps. Resected and retrieved.                           - Medium-sized lipoma in the proximal ascending                            colon.                           - Two 3 to 5 mm polyps in the transverse colon and                            in the ascending colon, removed with a cold  snare.                            Resected and retrieved.                           - Diverticulosis in the sigmoid colon.                           - Non-bleeding internal hemorrhoids. Moderate Sedation:      Per Anesthesia Care Recommendation:           - Discharge patient to home (ambulatory).                           - Resume previous diet.                           - Await pathology results.                           - Repeat colonoscopy for surveillance based on                            pathology results. Procedure Code(s):        --- Professional ---                           [phone], Colonoscopy, flexible; with removal of                            tumor(s), polyp(s), or other lesion(s) by snare  technique                           45380, 59, Colonoscopy, flexible; with biopsy,                            single or multiple Diagnosis Code(s):        --- Professional ---                           K63.5, Polyp of colon                           Z80.0, Family history of malignant neoplasm of                            digestive organs                           Z86.010, Personal history of colonic polyps                           D17.5, Benign lipomatous neoplasm of                            intra-abdominal organs                           K64.8, Other hemorrhoids                           K57.30, Diverticulosis of large intestine without                            perforation or abscess without bleeding CPT copyright 2019 American Medical Association. All rights reserved. The codes documented in this report are preliminary and upon coder review may  be revised to meet current compliance requirements. Maylon Peppers, MD Maylon Peppers,  08/12/2022 9:10:26 AM This report has been signed electronically. Number of Addenda: 0

## 2022-08-12 NOTE — Transfer of Care (Signed)
Immediate Anesthesia Transfer of Care Note  Patient: Parke Simmers  Procedure(s) Performed: COLONOSCOPY WITH PROPOFOL POLYPECTOMY BIOPSY  Patient Location: Endoscopy Unit  Anesthesia Type:General  Level of Consciousness: sedated and patient cooperative  Airway & Oxygen Therapy: Patient Spontanous Breathing  Post-op Assessment: Report given to RN and Post -op Vital signs reviewed and stable  Post vital signs: Reviewed and stable  Last Vitals:  Vitals Value Taken Time  BP    Temp    Pulse    Resp    SpO2      Last Pain:  Vitals:   08/12/22 0826  TempSrc:   PainSc: 0-No pain         Complications: No notable events documented.

## 2022-08-12 NOTE — Anesthesia Postprocedure Evaluation (Signed)
Anesthesia Post Note  Patient: Miranda Williams  Procedure(s) Performed: COLONOSCOPY WITH PROPOFOL POLYPECTOMY BIOPSY  Patient location during evaluation: Phase II Anesthesia Type: General Level of consciousness: awake and alert and oriented Pain management: pain level controlled Vital Signs Assessment: post-procedure vital signs reviewed and stable Respiratory status: spontaneous breathing, nonlabored ventilation and respiratory function stable Cardiovascular status: blood pressure returned to baseline and stable Postop Assessment: no apparent nausea or vomiting Anesthetic complications: no   No notable events documented.   Last Vitals:  Vitals:   08/12/22 0741 08/12/22 0906  BP: (!) 117/58 (!) 100/55  Pulse:  81  Resp:  (!) 26  Temp:  36.5 C  SpO2:  95%    Last Pain:  Vitals:   08/12/22 0906  TempSrc: Oral  PainSc: 0-No pain                 Christia Domke C Syla Devoss

## 2022-08-12 NOTE — Anesthesia Preprocedure Evaluation (Signed)
Anesthesia Evaluation  Patient identified by MRN, date of birth, ID band Patient awake    Reviewed: Allergy & Precautions, NPO status , Patient's Chart, lab work & pertinent test results  Airway Mallampati: I  TM Distance: >3 FB Neck ROM: Full    Dental  (+) Teeth Intact, Dental Advisory Given   Pulmonary neg pulmonary ROS,    Pulmonary exam normal breath sounds clear to auscultation       Cardiovascular hypertension, Pt. on medications Normal cardiovascular exam Rhythm:Regular Rate:Normal     Neuro/Psych negative neurological ROS  negative psych ROS   GI/Hepatic negative GI ROS, Neg liver ROS,   Endo/Other  diabetes, Well Controlled, Type 2, Oral Hypoglycemic Agents  Renal/GU negative Renal ROS  negative genitourinary   Musculoskeletal negative musculoskeletal ROS (+)   Abdominal   Peds negative pediatric ROS (+)  Hematology negative hematology ROS (+)   Anesthesia Other Findings   Reproductive/Obstetrics negative OB ROS                             Anesthesia Physical Anesthesia Plan  ASA: 2  Anesthesia Plan: General   Post-op Pain Management:    Induction: Intravenous  PONV Risk Score and Plan: TIVA  Airway Management Planned: Nasal Cannula and Natural Airway  Additional Equipment:   Intra-op Plan:   Post-operative Plan:   Informed Consent: I have reviewed the patients History and Physical, chart, labs and discussed the procedure including the risks, benefits and alternatives for the proposed anesthesia with the patient or authorized representative who has indicated his/her understanding and acceptance.     Dental advisory given  Plan Discussed with: CRNA and Surgeon  Anesthesia Plan Comments:         Anesthesia Quick Evaluation

## 2022-08-15 LAB — SURGICAL PATHOLOGY

## 2022-08-17 ENCOUNTER — Encounter (HOSPITAL_COMMUNITY): Payer: Self-pay | Admitting: Gastroenterology

## 2022-08-19 ENCOUNTER — Encounter (INDEPENDENT_AMBULATORY_CARE_PROVIDER_SITE_OTHER): Payer: Self-pay | Admitting: *Deleted

## 2022-08-22 ENCOUNTER — Other Ambulatory Visit (HOSPITAL_COMMUNITY): Payer: Self-pay

## 2022-08-25 ENCOUNTER — Other Ambulatory Visit (HOSPITAL_COMMUNITY): Payer: Self-pay

## 2022-08-26 ENCOUNTER — Other Ambulatory Visit (HOSPITAL_COMMUNITY): Payer: Self-pay

## 2022-08-26 MED ORDER — MOUNJARO 5 MG/0.5ML ~~LOC~~ SOAJ
5.0000 mg | SUBCUTANEOUS | 0 refills | Status: DC
  Filled 2022-08-26: qty 2, 28d supply, fill #0
  Filled 2022-08-29 – 2022-10-19 (×2): qty 3, 42d supply, fill #0
  Filled 2022-10-21: qty 2, 28d supply, fill #0

## 2022-08-27 ENCOUNTER — Other Ambulatory Visit (HOSPITAL_COMMUNITY): Payer: Self-pay

## 2022-08-29 ENCOUNTER — Other Ambulatory Visit (HOSPITAL_COMMUNITY): Payer: Self-pay

## 2022-08-31 ENCOUNTER — Other Ambulatory Visit (HOSPITAL_COMMUNITY): Payer: Self-pay

## 2022-09-02 ENCOUNTER — Other Ambulatory Visit (HOSPITAL_COMMUNITY): Payer: Self-pay

## 2022-09-02 MED ORDER — SAXENDA 18 MG/3ML ~~LOC~~ SOPN
3.0000 mg | PEN_INJECTOR | Freq: Every day | SUBCUTANEOUS | 1 refills | Status: DC
  Filled 2022-09-02: qty 15, 30d supply, fill #0
  Filled 2022-09-26: qty 15, 30d supply, fill #1

## 2022-09-05 ENCOUNTER — Other Ambulatory Visit (HOSPITAL_COMMUNITY): Payer: Self-pay

## 2022-09-23 ENCOUNTER — Other Ambulatory Visit (HOSPITAL_COMMUNITY): Payer: Self-pay

## 2022-09-26 ENCOUNTER — Other Ambulatory Visit (HOSPITAL_COMMUNITY): Payer: Self-pay

## 2022-09-29 ENCOUNTER — Other Ambulatory Visit (HOSPITAL_COMMUNITY): Payer: Self-pay

## 2022-09-29 DIAGNOSIS — S8264XA Nondisplaced fracture of lateral malleolus of right fibula, initial encounter for closed fracture: Secondary | ICD-10-CM | POA: Diagnosis not present

## 2022-09-29 MED ORDER — MELOXICAM 7.5 MG PO TABS
7.5000 mg | ORAL_TABLET | Freq: Every day | ORAL | 0 refills | Status: DC | PRN
  Filled 2022-09-29: qty 30, 30d supply, fill #0

## 2022-10-05 ENCOUNTER — Other Ambulatory Visit (HOSPITAL_COMMUNITY): Payer: Self-pay

## 2022-10-17 ENCOUNTER — Other Ambulatory Visit (HOSPITAL_COMMUNITY): Payer: Self-pay

## 2022-10-17 DIAGNOSIS — F411 Generalized anxiety disorder: Secondary | ICD-10-CM | POA: Diagnosis not present

## 2022-10-17 DIAGNOSIS — R030 Elevated blood-pressure reading, without diagnosis of hypertension: Secondary | ICD-10-CM | POA: Diagnosis not present

## 2022-10-17 DIAGNOSIS — R739 Hyperglycemia, unspecified: Secondary | ICD-10-CM | POA: Diagnosis not present

## 2022-10-17 DIAGNOSIS — Z6837 Body mass index (BMI) 37.0-37.9, adult: Secondary | ICD-10-CM | POA: Diagnosis not present

## 2022-10-18 ENCOUNTER — Other Ambulatory Visit (HOSPITAL_COMMUNITY): Payer: Self-pay

## 2022-10-18 ENCOUNTER — Other Ambulatory Visit: Payer: Self-pay

## 2022-10-18 MED ORDER — FLUOXETINE HCL 40 MG PO CAPS
40.0000 mg | ORAL_CAPSULE | Freq: Every day | ORAL | 1 refills | Status: DC
  Filled 2022-10-18: qty 90, 90d supply, fill #0
  Filled 2022-12-29 – 2023-01-17 (×2): qty 90, 90d supply, fill #1

## 2022-10-18 MED ORDER — MOUNJARO 5 MG/0.5ML ~~LOC~~ SOAJ
5.0000 mg | SUBCUTANEOUS | 0 refills | Status: DC
  Filled ????-??-??: fill #0

## 2022-10-19 ENCOUNTER — Other Ambulatory Visit (HOSPITAL_COMMUNITY): Payer: Self-pay

## 2022-10-20 ENCOUNTER — Other Ambulatory Visit (HOSPITAL_COMMUNITY): Payer: Self-pay

## 2022-10-21 ENCOUNTER — Other Ambulatory Visit (HOSPITAL_COMMUNITY): Payer: Self-pay

## 2022-10-25 ENCOUNTER — Other Ambulatory Visit: Payer: Self-pay

## 2022-10-25 ENCOUNTER — Other Ambulatory Visit (HOSPITAL_COMMUNITY): Payer: Self-pay

## 2022-11-03 ENCOUNTER — Other Ambulatory Visit (HOSPITAL_COMMUNITY): Payer: Self-pay

## 2022-11-10 ENCOUNTER — Other Ambulatory Visit (HOSPITAL_COMMUNITY): Payer: Self-pay

## 2022-11-15 ENCOUNTER — Other Ambulatory Visit: Payer: Self-pay

## 2022-11-17 DIAGNOSIS — R030 Elevated blood-pressure reading, without diagnosis of hypertension: Secondary | ICD-10-CM | POA: Diagnosis not present

## 2022-11-17 DIAGNOSIS — F411 Generalized anxiety disorder: Secondary | ICD-10-CM | POA: Diagnosis not present

## 2022-11-17 DIAGNOSIS — R739 Hyperglycemia, unspecified: Secondary | ICD-10-CM | POA: Diagnosis not present

## 2022-11-17 DIAGNOSIS — E6609 Other obesity due to excess calories: Secondary | ICD-10-CM | POA: Diagnosis not present

## 2022-11-17 DIAGNOSIS — Z6838 Body mass index (BMI) 38.0-38.9, adult: Secondary | ICD-10-CM | POA: Diagnosis not present

## 2022-11-18 ENCOUNTER — Other Ambulatory Visit (HOSPITAL_COMMUNITY): Payer: Self-pay

## 2022-11-18 MED ORDER — MOUNJARO 7.5 MG/0.5ML ~~LOC~~ SOAJ
7.5000 mg | SUBCUTANEOUS | 0 refills | Status: DC
  Filled 2022-11-18: qty 2, 28d supply, fill #0

## 2022-11-29 ENCOUNTER — Other Ambulatory Visit (HOSPITAL_COMMUNITY): Payer: Self-pay

## 2022-12-21 DIAGNOSIS — E6609 Other obesity due to excess calories: Secondary | ICD-10-CM | POA: Diagnosis not present

## 2022-12-21 DIAGNOSIS — Z6837 Body mass index (BMI) 37.0-37.9, adult: Secondary | ICD-10-CM | POA: Diagnosis not present

## 2022-12-21 DIAGNOSIS — R030 Elevated blood-pressure reading, without diagnosis of hypertension: Secondary | ICD-10-CM | POA: Diagnosis not present

## 2022-12-21 DIAGNOSIS — R739 Hyperglycemia, unspecified: Secondary | ICD-10-CM | POA: Diagnosis not present

## 2022-12-21 DIAGNOSIS — F411 Generalized anxiety disorder: Secondary | ICD-10-CM | POA: Diagnosis not present

## 2022-12-21 DIAGNOSIS — Z23 Encounter for immunization: Secondary | ICD-10-CM | POA: Diagnosis not present

## 2022-12-22 ENCOUNTER — Other Ambulatory Visit (HOSPITAL_COMMUNITY): Payer: Self-pay

## 2022-12-22 ENCOUNTER — Other Ambulatory Visit: Payer: Self-pay

## 2022-12-22 MED ORDER — MOUNJARO 10 MG/0.5ML ~~LOC~~ SOAJ
10.0000 mg | SUBCUTANEOUS | 0 refills | Status: DC
  Filled 2022-12-22: qty 2, 28d supply, fill #0

## 2022-12-23 ENCOUNTER — Other Ambulatory Visit (HOSPITAL_COMMUNITY): Payer: Self-pay

## 2022-12-26 ENCOUNTER — Other Ambulatory Visit (HOSPITAL_COMMUNITY): Payer: Self-pay

## 2022-12-26 ENCOUNTER — Other Ambulatory Visit: Payer: Self-pay

## 2022-12-26 MED ORDER — MOUNJARO 7.5 MG/0.5ML ~~LOC~~ SOAJ
7.5000 mg | SUBCUTANEOUS | 0 refills | Status: DC
  Filled 2022-12-26 (×2): qty 2, 28d supply, fill #0

## 2022-12-27 ENCOUNTER — Other Ambulatory Visit (HOSPITAL_COMMUNITY): Payer: Self-pay

## 2022-12-29 ENCOUNTER — Other Ambulatory Visit: Payer: Self-pay

## 2022-12-29 ENCOUNTER — Other Ambulatory Visit (HOSPITAL_COMMUNITY): Payer: Self-pay

## 2023-01-02 ENCOUNTER — Other Ambulatory Visit (HOSPITAL_COMMUNITY): Payer: Self-pay

## 2023-01-02 ENCOUNTER — Other Ambulatory Visit: Payer: Self-pay

## 2023-01-02 MED ORDER — ROSUVASTATIN CALCIUM 5 MG PO TABS
5.0000 mg | ORAL_TABLET | Freq: Every evening | ORAL | 1 refills | Status: DC
  Filled 2023-01-02: qty 90, 90d supply, fill #0
  Filled 2023-01-17 – 2023-03-28 (×2): qty 90, 90d supply, fill #1

## 2023-01-17 ENCOUNTER — Other Ambulatory Visit (HOSPITAL_COMMUNITY): Payer: Self-pay

## 2023-01-17 ENCOUNTER — Other Ambulatory Visit: Payer: Self-pay

## 2023-01-17 DIAGNOSIS — F411 Generalized anxiety disorder: Secondary | ICD-10-CM | POA: Diagnosis not present

## 2023-01-17 DIAGNOSIS — E782 Mixed hyperlipidemia: Secondary | ICD-10-CM | POA: Diagnosis not present

## 2023-01-17 DIAGNOSIS — Z0000 Encounter for general adult medical examination without abnormal findings: Secondary | ICD-10-CM | POA: Diagnosis not present

## 2023-01-17 DIAGNOSIS — R7303 Prediabetes: Secondary | ICD-10-CM | POA: Diagnosis not present

## 2023-01-17 DIAGNOSIS — Z23 Encounter for immunization: Secondary | ICD-10-CM | POA: Diagnosis not present

## 2023-01-17 DIAGNOSIS — E7849 Other hyperlipidemia: Secondary | ICD-10-CM | POA: Diagnosis not present

## 2023-01-17 DIAGNOSIS — Z6837 Body mass index (BMI) 37.0-37.9, adult: Secondary | ICD-10-CM | POA: Diagnosis not present

## 2023-01-17 DIAGNOSIS — R030 Elevated blood-pressure reading, without diagnosis of hypertension: Secondary | ICD-10-CM | POA: Diagnosis not present

## 2023-01-18 ENCOUNTER — Other Ambulatory Visit: Payer: Self-pay

## 2023-01-18 ENCOUNTER — Other Ambulatory Visit (HOSPITAL_COMMUNITY): Payer: Self-pay

## 2023-01-18 MED ORDER — SPIRONOLACTONE 50 MG PO TABS
50.0000 mg | ORAL_TABLET | Freq: Every day | ORAL | 1 refills | Status: DC
  Filled 2023-01-18 – 2023-01-29 (×2): qty 90, 90d supply, fill #0
  Filled 2023-05-04: qty 90, 90d supply, fill #1

## 2023-01-18 MED ORDER — ROSUVASTATIN CALCIUM 5 MG PO TABS
5.0000 mg | ORAL_TABLET | Freq: Every evening | ORAL | 1 refills | Status: DC
  Filled 2023-01-18 – 2023-09-25 (×2): qty 90, 90d supply, fill #0

## 2023-01-18 MED ORDER — MOUNJARO 10 MG/0.5ML ~~LOC~~ SOAJ
10.0000 mg | SUBCUTANEOUS | 1 refills | Status: DC
  Filled 2023-01-18: qty 2, 28d supply, fill #0

## 2023-01-19 ENCOUNTER — Other Ambulatory Visit (HOSPITAL_COMMUNITY): Payer: Self-pay

## 2023-01-30 ENCOUNTER — Other Ambulatory Visit: Payer: Self-pay

## 2023-03-20 DIAGNOSIS — Z6837 Body mass index (BMI) 37.0-37.9, adult: Secondary | ICD-10-CM | POA: Diagnosis not present

## 2023-03-20 DIAGNOSIS — R030 Elevated blood-pressure reading, without diagnosis of hypertension: Secondary | ICD-10-CM | POA: Diagnosis not present

## 2023-03-20 DIAGNOSIS — R7303 Prediabetes: Secondary | ICD-10-CM | POA: Diagnosis not present

## 2023-03-20 DIAGNOSIS — F411 Generalized anxiety disorder: Secondary | ICD-10-CM | POA: Diagnosis not present

## 2023-03-21 ENCOUNTER — Other Ambulatory Visit (HOSPITAL_COMMUNITY): Payer: Self-pay

## 2023-03-21 MED ORDER — CONTRAVE 8-90 MG PO TB12
ORAL_TABLET | ORAL | 0 refills | Status: DC
  Filled 2023-03-21 – 2023-04-04 (×2): qty 90, 28d supply, fill #0
  Filled 2023-04-14: qty 78, 30d supply, fill #0

## 2023-03-28 ENCOUNTER — Other Ambulatory Visit (HOSPITAL_COMMUNITY): Payer: Self-pay

## 2023-03-30 ENCOUNTER — Other Ambulatory Visit: Payer: Self-pay

## 2023-03-30 ENCOUNTER — Other Ambulatory Visit (HOSPITAL_COMMUNITY): Payer: Self-pay

## 2023-03-31 ENCOUNTER — Other Ambulatory Visit (HOSPITAL_COMMUNITY): Payer: Self-pay

## 2023-04-04 ENCOUNTER — Other Ambulatory Visit (HOSPITAL_COMMUNITY): Payer: Self-pay

## 2023-04-10 ENCOUNTER — Other Ambulatory Visit: Payer: Self-pay

## 2023-04-13 DIAGNOSIS — S8264XD Nondisplaced fracture of lateral malleolus of right fibula, subsequent encounter for closed fracture with routine healing: Secondary | ICD-10-CM | POA: Diagnosis not present

## 2023-04-14 ENCOUNTER — Other Ambulatory Visit (HOSPITAL_COMMUNITY): Payer: Self-pay

## 2023-04-17 ENCOUNTER — Other Ambulatory Visit: Payer: Self-pay

## 2023-04-17 ENCOUNTER — Encounter (INDEPENDENT_AMBULATORY_CARE_PROVIDER_SITE_OTHER): Payer: Commercial Managed Care - PPO | Admitting: Family Medicine

## 2023-04-17 ENCOUNTER — Other Ambulatory Visit (HOSPITAL_COMMUNITY): Payer: Self-pay

## 2023-04-19 ENCOUNTER — Other Ambulatory Visit (HOSPITAL_COMMUNITY): Payer: Self-pay

## 2023-04-20 ENCOUNTER — Other Ambulatory Visit (HOSPITAL_COMMUNITY): Payer: Self-pay

## 2023-04-20 MED ORDER — CONTRAVE 8-90 MG PO TB12: ORAL_TABLET | ORAL | 0 refills | Status: DC

## 2023-04-25 ENCOUNTER — Telehealth (INDEPENDENT_AMBULATORY_CARE_PROVIDER_SITE_OTHER): Payer: Commercial Managed Care - PPO | Admitting: Family Medicine

## 2023-04-25 ENCOUNTER — Encounter (INDEPENDENT_AMBULATORY_CARE_PROVIDER_SITE_OTHER): Payer: Self-pay | Admitting: Family Medicine

## 2023-04-25 DIAGNOSIS — E7849 Other hyperlipidemia: Secondary | ICD-10-CM

## 2023-04-25 DIAGNOSIS — R7303 Prediabetes: Secondary | ICD-10-CM | POA: Diagnosis not present

## 2023-04-25 DIAGNOSIS — Z6836 Body mass index (BMI) 36.0-36.9, adult: Secondary | ICD-10-CM

## 2023-04-25 DIAGNOSIS — E669 Obesity, unspecified: Secondary | ICD-10-CM | POA: Diagnosis not present

## 2023-04-25 NOTE — Progress Notes (Signed)
Office: [phone]  /  Fax: [phone]   TeleHealth Visit:  This visit was completed with telemedicine (audio/video) technology. Miranda Williams has verbally consented to this TeleHealth visit. The patient is located at home, the provider is located at home. The participants in this visit include the listed provider and patient. The visit was conducted today via MyChart video.  Initial Visit  Miranda Williams was seen via virtual visit today to evaluate for treatment of obesity. She is interested in losing weight to improve overall health and reduce the risk of weight related complications. She presents today to review program treatment options, initial physical assessment, and evaluation.     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  When asked what else they would like to accomplish? She states: Other: wants to be a healthy weight-around 170 lbs.  Weight history: Has been overweight her whole life. Has lost and regained weight several times.  When asked how has your weight affected you? She states: Contributed to medical problems and Contributed to orthopedic problems or mobility issues  Some associated conditions: Hyperlipidemia and Prediabetes  Contributing factors: Family history, Nutritional, and Menopause  Weight promoting medications identified: Psychotropic medications  Current nutrition plan: None  Current level of physical activity: Walking  Current or previous pharmacotherapy: GLP-1, currently has prescription for Contrave.  Advised her to hold off on taking it until she meets with one of our physicians.  Response to medication: Was cost prohibitive or lost coverage for AOM.  Past medical history includes:   Past Medical History:  Diagnosis Date   Breast disorder    Diabetes mellitus without complication ([facility])    Dystrophic radiologic calcification 03/05/2014   Right breast; removed 02/2013;  benign   Gallstones    Hyperlipidemia    Hypertension    Vaginal Pap smear, abnormal    +HPV     Objective:     General:  Alert, oriented and cooperative. Patient is in no acute distress.  Respiratory: Normal respiratory effort, no problems with respiration noted  Mental Status: Normal mood and affect. Normal behavior. Normal judgment and thought content.    Assessment and Plan:   1. Prediabetes A1c was 5.9 back in 2020.  I have no recent A1c. Medication(s): None Polyphagia:Yes Lab Results  Component Value Date   HGBA1C 5.9 (H) 05/30/2019   No results found for: "INSULIN"  Plan: Begin working on healthy diet to improve this condition.   2. Hyperlipidemia Fasting lipid panel from 07/05/2022: HDL low at 44, triglycerides elevated at 155, LDL at goal at 78.  Patient is on Crestor 5 mg daily.  Lab Results  Component Value Date   CHOL 149 07/05/2022   HDL 44 07/05/2022   LDLCALC 78 07/05/2022   TRIG 155 (H) 07/05/2022   CHOLHDL 3.4 07/05/2022   CHOLHDL 3.4 12/21/2020   CHOLHDL 5.7 05/30/2019   Lab Results  Component Value Date   ALT 21 07/05/2022   AST 18 07/05/2022   ALKPHOS 101 07/05/2022   BILITOT 0.3 07/05/2022   The 10-year ASCVD risk score (Arnett DK, et al., 2019) is: 2.3%   Values used to calculate the score:     Age: 52 years     Sex: Female     Is Non-Hispanic African American: No     Diabetic: Yes     Tobacco smoker: No     Systolic Blood Pressure: 100 mmHg  Is BP treated: Yes     HDL Cholesterol: 44 mg/dL     Total Cholesterol: 149 mg/dL  Plan: Continue statin. Begin lifestyle interventions through our program.  3. Obesity Treatment / Action Plan:  Will complete provided nutritional and psychosocial assessment questionnaire before the next appointment. Will be scheduled for indirect calorimetry to determine resting energy expenditure in a fasting state.  This will allow [place] to create a reduced calorie, high-protein meal plan to promote  loss of fat mass while preserving muscle mass. Was counseled on nutritional approaches to weight loss and benefits of reducing processed foods and consuming plant-based foods and high quality protein as part of nutritional weight management. Was counseled on pharmacotherapy and role as an adjunct in weight management.   Obesity Education Performed Today:  We discussed obesity as a disease and the importance of a more detailed evaluation of all the factors contributing to the disease.  We discussed the importance of long term lifestyle changes which include nutrition, exercise and behavioral modifications as well as the importance of customizing this to her specific health and social needs.  We discussed the benefits of reaching a healthier weight to alleviate the symptoms of existing conditions and reduce the risks of the biomechanical, metabolic and psychological effects of obesity.  Miranda Williams appears to be in the action stage of change and states they are ready to start intensive lifestyle modifications and behavioral modifications.  ______________________________________________________________________________  She will be contacted by [facility] to set up initial appointment and the first follow up appointment with a physician.   The following office policies were discussed. She voiced understanding: - Patient will be considered late at 6 minutes past appointment time.   - For the first office visit, patient needs to arrive 1 hour early, fasting except for water. Patient should arrive 15 minutes early for all other visits. -Patient is aware she must bring completed new patient packet to her first visit or the visit will be rescheduled.  30 minutes was spent today on this visit including the above counseling, pre-visit chart review, and post-visit documentation.  Reviewed by clinician on day of visit: allergies, medications, problem list, medical history, surgical  history, family history, social history, and previous encounter notes pertinent to obesity diagnosis.    Jesse Sans, FNP

## 2023-04-26 DIAGNOSIS — Z0289 Encounter for other administrative examinations: Secondary | ICD-10-CM

## 2023-04-28 ENCOUNTER — Other Ambulatory Visit (HOSPITAL_COMMUNITY): Payer: Self-pay

## 2023-05-04 ENCOUNTER — Other Ambulatory Visit (HOSPITAL_COMMUNITY): Payer: Self-pay

## 2023-05-05 ENCOUNTER — Other Ambulatory Visit (HOSPITAL_COMMUNITY): Payer: Self-pay

## 2023-05-07 ENCOUNTER — Other Ambulatory Visit (HOSPITAL_COMMUNITY): Payer: Self-pay

## 2023-05-08 ENCOUNTER — Other Ambulatory Visit (HOSPITAL_COMMUNITY): Payer: Self-pay

## 2023-05-08 MED ORDER — SPIRONOLACTONE 50 MG PO TABS
50.0000 mg | ORAL_TABLET | Freq: Every day | ORAL | 1 refills | Status: DC
  Filled 2023-05-08 – 2023-08-01 (×2): qty 90, 90d supply, fill #0
  Filled 2023-09-25: qty 90, 90d supply, fill #1

## 2023-05-08 MED ORDER — FLUOXETINE HCL 40 MG PO CAPS
40.0000 mg | ORAL_CAPSULE | Freq: Every day | ORAL | 1 refills | Status: DC
  Filled 2023-05-08: qty 90, 90d supply, fill #0
  Filled 2023-08-01: qty 90, 90d supply, fill #1

## 2023-05-15 ENCOUNTER — Ambulatory Visit (INDEPENDENT_AMBULATORY_CARE_PROVIDER_SITE_OTHER): Payer: Commercial Managed Care - PPO | Admitting: Family Medicine

## 2023-05-15 ENCOUNTER — Encounter (INDEPENDENT_AMBULATORY_CARE_PROVIDER_SITE_OTHER): Payer: Self-pay | Admitting: Family Medicine

## 2023-05-15 VITALS — BP 106/68 | HR 61 | Temp 97.9°F | Ht 62.5 in | Wt 201.0 lb

## 2023-05-15 DIAGNOSIS — R0602 Shortness of breath: Secondary | ICD-10-CM

## 2023-05-15 DIAGNOSIS — E669 Obesity, unspecified: Secondary | ICD-10-CM | POA: Diagnosis not present

## 2023-05-15 DIAGNOSIS — Z6833 Body mass index (BMI) 33.0-33.9, adult: Secondary | ICD-10-CM | POA: Insufficient documentation

## 2023-05-15 DIAGNOSIS — G4733 Obstructive sleep apnea (adult) (pediatric): Secondary | ICD-10-CM

## 2023-05-15 DIAGNOSIS — E559 Vitamin D deficiency, unspecified: Secondary | ICD-10-CM | POA: Diagnosis not present

## 2023-05-15 DIAGNOSIS — Z1331 Encounter for screening for depression: Secondary | ICD-10-CM | POA: Insufficient documentation

## 2023-05-15 DIAGNOSIS — R7303 Prediabetes: Secondary | ICD-10-CM

## 2023-05-15 DIAGNOSIS — E7849 Other hyperlipidemia: Secondary | ICD-10-CM | POA: Diagnosis not present

## 2023-05-15 DIAGNOSIS — F32A Depression, unspecified: Secondary | ICD-10-CM | POA: Diagnosis not present

## 2023-05-15 DIAGNOSIS — R5383 Other fatigue: Secondary | ICD-10-CM

## 2023-05-15 DIAGNOSIS — Z6834 Body mass index (BMI) 34.0-34.9, adult: Secondary | ICD-10-CM | POA: Insufficient documentation

## 2023-05-15 DIAGNOSIS — Z6836 Body mass index (BMI) 36.0-36.9, adult: Secondary | ICD-10-CM | POA: Diagnosis not present

## 2023-05-16 LAB — CBC WITH DIFFERENTIAL/PLATELET
Basophils Absolute: 0.1 10*3/uL (ref 0.0–0.2)
Basos: 1 %
EOS (ABSOLUTE): 0.2 10*3/uL (ref 0.0–0.4)
Eos: 2 %
Hematocrit: 41 % (ref 34.0–46.6)
Hemoglobin: 13.7 g/dL (ref 11.1–15.9)
Immature Grans (Abs): 0 10*3/uL (ref 0.0–0.1)
Immature Granulocytes: 0 %
Lymphocytes Absolute: 2.5 10*3/uL (ref 0.7–3.1)
Lymphs: 30 %
MCH: 29.1 pg (ref 26.6–33.0)
MCHC: 33.4 g/dL (ref 31.5–35.7)
MCV: 87 fL (ref 79–97)
Monocytes Absolute: 0.5 10*3/uL (ref 0.1–0.9)
Monocytes: 6 %
Neutrophils Absolute: 5 10*3/uL (ref 1.4–7.0)
Neutrophils: 61 %
Platelets: 293 10*3/uL (ref 150–450)
RBC: 4.71 x10E6/uL (ref 3.77–5.28)
RDW: 11.9 % (ref 11.7–15.4)
WBC: 8.3 10*3/uL (ref 3.4–10.8)

## 2023-05-16 LAB — CMP14+EGFR
ALT: 20 IU/L (ref 0–32)
AST: 16 IU/L (ref 0–40)
Albumin: 4.4 g/dL (ref 3.8–4.9)
Alkaline Phosphatase: 102 IU/L (ref 44–121)
BUN/Creatinine Ratio: 19 (ref 9–23)
BUN: 13 mg/dL (ref 6–24)
Bilirubin Total: 0.3 mg/dL (ref 0.0–1.2)
CO2: 20 mmol/L (ref 20–29)
Calcium: 9.4 mg/dL (ref 8.7–10.2)
Chloride: 100 mmol/L (ref 96–106)
Creatinine, Ser: 0.7 mg/dL (ref 0.57–1.00)
Globulin, Total: 2.5 g/dL (ref 1.5–4.5)
Glucose: 173 mg/dL — ABNORMAL HIGH (ref 70–99)
Potassium: 4.5 mmol/L (ref 3.5–5.2)
Sodium: 135 mmol/L (ref 134–144)
Total Protein: 6.9 g/dL (ref 6.0–8.5)
eGFR: 103 mL/min/{1.73_m2} (ref >59)

## 2023-05-16 LAB — LIPID PANEL WITH LDL/HDL RATIO
Cholesterol, Total: 168 mg/dL (ref 100–199)
HDL: 44 mg/dL (ref >39)
LDL Chol Calc (NIH): 99 mg/dL (ref 0–99)
LDL/HDL Ratio: 2.3 ratio (ref 0.0–3.2)
Triglycerides: 142 mg/dL (ref 0–149)
VLDL Cholesterol Cal: 25 mg/dL (ref 5–40)

## 2023-05-16 LAB — VITAMIN B12: Vitamin B-12: 493 pg/mL (ref 232–1245)

## 2023-05-16 LAB — HEMOGLOBIN A1C
Est. average glucose Bld gHb Est-mCnc: 163 mg/dL
Hgb A1c MFr Bld: 7.3 % — ABNORMAL HIGH (ref 4.8–5.6)

## 2023-05-16 LAB — TSH: TSH: 2.05 u[IU]/mL (ref 0.450–4.500)

## 2023-05-16 LAB — VITAMIN D 25 HYDROXY (VIT D DEFICIENCY, FRACTURES): Vit D, 25-Hydroxy: 29.6 ng/mL — ABNORMAL LOW (ref 30.0–100.0)

## 2023-05-16 LAB — INSULIN, RANDOM: INSULIN: 23.7 u[IU]/mL (ref 2.6–24.9)

## 2023-05-16 NOTE — Progress Notes (Signed)
Chief Complaint:   OBESITY Miranda Williams (MR# [id number]) is a 52 y.o. female who presents for evaluation and treatment of obesity and related comorbidities. Current BMI is Body mass index is 36.18 kg/m. Miranda Williams has been struggling with her weight for many years and has been unsuccessful in either losing weight, maintaining weight loss, or reaching her healthy weight goal.  Miranda Williams is currently in the action stage of change and ready to dedicate time achieving and maintaining a healthier weight. Miranda Williams is interested in becoming our patient and working on intensive lifestyle modifications including (but not limited to) diet and exercise for weight loss.  Miranda Williams's habits were reviewed today and are as follows: Her family eats meals together, she thinks her family will eat healthier with her, she struggles with family and or coworkers weight loss sabotage, her desired weight loss is 41 lbs, she has been heavy most of her life, she started gaining weight as she has always been up and down, her heaviest weight ever was 220 pounds, she has significant food cravings issues, she skips meals frequently, she is frequently drinking liquids with calories, she frequently makes poor food choices, she has problems with excessive hunger, she frequently eats larger portions than normal, she has binge eating behaviors, and she struggles with emotional eating.  Depression Screen Santana's Food and Mood (modified PHQ-9) score was 21.  Subjective:   1. Other fatigue Miranda Williams admits to daytime somnolence and admits to waking up still tired. Patient has a history of symptoms of daytime fatigue, morning fatigue, and morning headache. Miranda Williams generally gets 8 hours of sleep per night, and states that she has nightime awakenings and generally restful sleep. Snoring is present. Apneic episodes are not present. Epworth Sleepiness Score is 9.   2. SOBOE (shortness of breath on exertion) Miranda Williams notes increasing shortness of breath  with exercising and seems to be worsening over time with weight gain. She notes getting out of breath sooner with activity than she used to. This has not gotten worse recently. Miranda Williams denies shortness of breath at rest or orthopnea.  3. Prediabetes Patient has a history of elevated glucose and elevated A1c.  She is not on medications, and she is ready to work on her diet.  4. Other hyperlipidemia Patient is on Crestor, and she is working on her diet.  She denies chest pain or myalgias.  5. OSA (obstructive sleep apnea) Patient was diagnosed 2 years ago, and she uses a mouth appliance.  6. Vitamin D deficiency Patient has a history of vitamin D deficiency.  She is not on vitamin D supplementation, and she notes fatigue.  Assessment/Plan:   1. Other fatigue Miranda Williams does feel that her weight is causing her energy to be lower than it should be. Fatigue may be related to obesity, depression or many other causes. Labs will be ordered, and in the meanwhile, Miranda Williams will focus on self care including making healthy food choices, increasing physical activity and focusing on stress reduction.  - EKG 12-Lead - CBC with Differential/Platelet - TSH  2. SOBOE (shortness of breath on exertion) Miranda Williams does feel that she gets out of breath more easily that she used to when she exercises. Miranda Williams's shortness of breath appears to be obesity related and exercise induced. She has agreed to work on weight loss and gradually increase exercise to treat her exercise induced shortness of breath. Will continue to monitor closely.  3. Prediabetes We will check labs today.  Patient will start  on her category 2 eating plan, and we will follow-up at her next visit in 2 weeks.  - Vitamin B12 - CMP14+EGFR - Insulin, random - Hemoglobin A1c  4. Other hyperlipidemia We will check labs today.  Patient will start on her category 2 eating plan, and we will follow-up at her next visit in 2 weeks.  - Lipid Panel With LDL/HDL  Ratio  5. OSA (obstructive sleep apnea) Patient will work on her diet and weight loss to treat, may need to consider CPAP in the future.  6. Vitamin D deficiency We will check labs today, and we will follow-up at patient's next visit.  - VITAMIN D 25 Hydroxy (Vit-D Deficiency, Fractures)  7. Depression screening Miranda Williams had a positive depression screening. Depression is commonly associated with obesity and often results in emotional eating behaviors. We will monitor this closely and work on CBT to help improve the non-hunger eating patterns. Referral to Psychology may be required if no improvement is seen as she continues in our clinic.  8. BMI 36.0-36.9,adult  9. Obesity, Beginning BMI 36.3 Miranda Williams is currently in the action stage of change and her goal is to continue with weight loss efforts. I recommend Miranda Williams begin the structured treatment plan as follows:  She has agreed to the Category 2 Plan.  Exercise goals: No exercise has been prescribed for now, while we concentrate on nutritional changes.   Behavioral modification strategies: decreasing eating out, no skipping meals, and better snacking choices.  She was informed of the importance of frequent follow-up visits to maximize her success with intensive lifestyle modifications for her multiple health conditions. She was informed we would discuss her lab results at her next visit unless there is a critical issue that needs to be addressed sooner. Miranda Williams agreed to keep her next visit at the agreed upon time to discuss these results.  Objective:   Blood pressure 106/68, pulse 61, temperature 97.9 F (36.6 C), height 5' 2.5" (1.588 m), weight 201 lb (91.2 kg), SpO2 95%. Body mass index is 36.18 kg/m.  EKG: Normal sinus rhythm, rate 64 BPM.  Indirect Calorimeter completed today shows a VO2 of 215 and a REE of 1483.  Her calculated basal metabolic rate is 2595 thus her basal metabolic rate is worse than expected.  General: Cooperative,  alert, well developed, in no acute distress. HEENT: Conjunctivae and lids unremarkable. Cardiovascular: Regular rhythm.  Lungs: Normal work of breathing. Neurologic: No focal deficits.   Lab Results  Component Value Date   CREATININE 0.67 08/10/2022   BUN 19 08/10/2022   NA 139 08/10/2022   K 4.0 08/10/2022   CL 105 08/10/2022   CO2 26 08/10/2022   Lab Results  Component Value Date   ALT 21 07/05/2022   AST 18 07/05/2022   ALKPHOS 101 07/05/2022   BILITOT 0.3 07/05/2022   Lab Results  Component Value Date   HGBA1C 5.9 (H) 05/30/2019   No results found for: "INSULIN" Lab Results  Component Value Date   TSH 1.580 07/05/2022   Lab Results  Component Value Date   CHOL 149 07/05/2022   HDL 44 07/05/2022   LDLCALC 78 07/05/2022   TRIG 155 (H) 07/05/2022   CHOLHDL 3.4 07/05/2022   Lab Results  Component Value Date   WBC 7.0 07/05/2022   HGB 13.1 07/05/2022   HCT 38.9 07/05/2022   MCV 86 07/05/2022   PLT 288 07/05/2022   No results found for: "IRON", "TIBC", "FERRITIN"  Attestation Statements:   Reviewed  by clinician on day of visit: allergies, medications, problem list, medical history, surgical history, family history, social history, and previous encounter notes.  Time spent on visit including pre-visit chart review and post-visit charting and care was 40 minutes.   I, Burt Knack, am acting as transcriptionist for Quillian Quince, MD.  I have reviewed the above documentation for accuracy and completeness, and I agree with the above. - Quillian Quince, MD

## 2023-05-25 ENCOUNTER — Other Ambulatory Visit (HOSPITAL_COMMUNITY): Payer: Self-pay

## 2023-05-25 MED ORDER — MOUNJARO 2.5 MG/0.5ML ~~LOC~~ SOAJ
2.5000 mg | SUBCUTANEOUS | 0 refills | Status: DC
  Filled 2023-05-25 (×2): qty 2, 28d supply, fill #0

## 2023-05-27 ENCOUNTER — Other Ambulatory Visit (HOSPITAL_COMMUNITY): Payer: Self-pay

## 2023-05-27 MED ORDER — FREESTYLE LIBRE 2 SENSOR MISC
12 refills | Status: AC
  Filled 2023-05-27: qty 2, 28d supply, fill #0

## 2023-05-27 MED ORDER — FREESTYLE LIBRE 2 READER DEVI
0 refills | Status: AC
  Filled 2023-05-27: qty 1, 30d supply, fill #0

## 2023-05-29 ENCOUNTER — Ambulatory Visit (INDEPENDENT_AMBULATORY_CARE_PROVIDER_SITE_OTHER): Payer: Commercial Managed Care - PPO | Admitting: Family Medicine

## 2023-05-29 ENCOUNTER — Other Ambulatory Visit (HOSPITAL_COMMUNITY): Payer: Self-pay

## 2023-05-29 ENCOUNTER — Encounter (INDEPENDENT_AMBULATORY_CARE_PROVIDER_SITE_OTHER): Payer: Self-pay | Admitting: Family Medicine

## 2023-05-29 ENCOUNTER — Other Ambulatory Visit: Payer: Self-pay

## 2023-05-29 VITALS — BP 107/62 | HR 78 | Temp 98.3°F | Ht 62.5 in | Wt 199.0 lb

## 2023-05-29 DIAGNOSIS — E7849 Other hyperlipidemia: Secondary | ICD-10-CM

## 2023-05-29 DIAGNOSIS — E559 Vitamin D deficiency, unspecified: Secondary | ICD-10-CM | POA: Diagnosis not present

## 2023-05-29 DIAGNOSIS — E1169 Type 2 diabetes mellitus with other specified complication: Secondary | ICD-10-CM | POA: Diagnosis not present

## 2023-05-29 DIAGNOSIS — Z7985 Long-term (current) use of injectable non-insulin antidiabetic drugs: Secondary | ICD-10-CM | POA: Diagnosis not present

## 2023-05-29 DIAGNOSIS — Z6835 Body mass index (BMI) 35.0-35.9, adult: Secondary | ICD-10-CM

## 2023-05-29 DIAGNOSIS — E669 Obesity, unspecified: Secondary | ICD-10-CM

## 2023-05-29 MED ORDER — VITAMIN D (ERGOCALCIFEROL) 1.25 MG (50000 UNIT) PO CAPS
50000.0000 [IU] | ORAL_CAPSULE | ORAL | 0 refills | Status: DC
  Filled 2023-05-29: qty 4, 28d supply, fill #0

## 2023-05-29 NOTE — Progress Notes (Deleted)
.smr  Office: [phone]  /  Fax: [phone]  WEIGHT SUMMARY AND BIOMETRICS  Anthropometric Measurements Height: 5' 2.5" (1.588 m) Weight: 199 lb (90.3 kg) BMI (Calculated): 35.8 Weight at Last Visit: 201 lb Weight Lost Since Last Visit: 2 lb Weight Gained Since Last Visit: 0 Starting Weight: 201 lb Peak Weight: 220 lb   Body Composition  Body Fat %: 42.6 % Fat Mass (lbs): 85 lbs Muscle Mass (lbs): 108.6 lbs Total Body Water (lbs): 75.2 lbs Visceral Fat Rating : 12   Other Clinical Data Fasting: no Labs: no Today's Visit #: 2 Starting Date: 05/15/23    Chief Complaint: OBESITY   Discussed the use of AI scribe software for clinical note transcription with the patient, who gave verbal consent to proceed.  History of Present Illness              PHYSICAL EXAM:  Blood pressure 107/62, pulse 78, temperature 98.3 F (36.8 C), height 5' 2.5" (1.588 m), weight 199 lb (90.3 kg), SpO2 96%. Body mass index is 35.82 kg/m.  DIAGNOSTIC DATA REVIEWED:  BMET    Component Value Date/Time   NA 135 05/15/2023 0956   K 4.5 05/15/2023 0956   CL 100 05/15/2023 0956   CO2 20 05/15/2023 0956   GLUCOSE 173 (H) 05/15/2023 0956   GLUCOSE 162 (H) 08/10/2022 0819   BUN 13 05/15/2023 0956   CREATININE 0.70 05/15/2023 0956   CREATININE 0.59 12/21/2020 1421   CALCIUM 9.4 05/15/2023 0956   GFRNONAA >60 08/10/2022 0819   GFRAA >60 05/30/2019 0818   Lab Results  Component Value Date   HGBA1C 7.3 (H) 05/15/2023   HGBA1C 5.9 (H) 05/30/2019   Lab Results  Component Value Date   INSULIN 23.7 05/15/2023   Lab Results  Component Value Date   TSH 2.050 05/15/2023   CBC    Component Value Date/Time   WBC 8.3 05/15/2023 0956   WBC 7.7 12/21/2020 1421   RBC 4.71 05/15/2023 0956   RBC 4.31 12/21/2020 1421   HGB 13.7 05/15/2023 0956   HCT 41.0 05/15/2023 0956   PLT 293 05/15/2023 0956   MCV 87 05/15/2023 0956   MCH 29.1 05/15/2023 0956   MCH 29.5 12/21/2020 1421    MCHC 33.4 05/15/2023 0956   MCHC 34.1 12/21/2020 1421   RDW 11.9 05/15/2023 0956   Iron Studies No results found for: "IRON", "TIBC", "FERRITIN", "IRONPCTSAT" Lipid Panel     Component Value Date/Time   CHOL 168 05/15/2023 0956   TRIG 142 05/15/2023 0956   HDL 44 05/15/2023 0956   CHOLHDL 3.4 07/05/2022 1601   CHOLHDL 3.4 12/21/2020 1421   VLDL 14 05/30/2019 0819   LDLCALC 99 05/15/2023 0956   LDLCALC 85 12/21/2020 1421   Hepatic Function Panel     Component Value Date/Time   PROT 6.9 05/15/2023 0956   ALBUMIN 4.4 05/15/2023 0956   AST 16 05/15/2023 0956   ALT 20 05/15/2023 0956   ALKPHOS 102 05/15/2023 0956   BILITOT 0.3 05/15/2023 0956      Component Value Date/Time   TSH 2.050 05/15/2023 0956   Nutritional Lab Results  Component Value Date   VD25OH 29.6 (L) 05/15/2023   VD25OH 27.1 (L) 05/30/2019     Assessment and Plan              I have personally spent *** minutes total time today in preparation, patient care, and documentation for this visit, including the following: review of clinical lab tests; review  of medical tests/procedures/services.    She was informed of the importance of frequent follow up visits to maximize her success with intensive lifestyle modifications for her multiple health conditions.    Quillian Quince, MD

## 2023-05-30 NOTE — Progress Notes (Signed)
Chief Complaint:   OBESITY Miranda Williams is here to discuss her progress with her obesity treatment plan along with follow-up of her obesity related diagnoses. Miranda Williams is on the Category 2 Plan and states she is following her eating plan approximately 90% of the time. Miranda Williams states she is doing 0 minutes 0 times per week.  Today's visit was #: 2 Starting weight: 201 lbs Starting date: 05/15/2023 Today's weight: 199 lbs Today's date: 05/29/2023 Total lbs lost to date: 2 Total lbs lost since last in-office visit: 2  Interim History: Patient is on category 2 eating plan and she did well with following up.  She did not struggle with eating all of the food on her plan.  Her hunger was mostly controlled.  She will likely need to have more freedom with her eating plan in the future.  Subjective:   1. Type 2 diabetes mellitus with other specified complication, without long-term current use of insulin [facility]) Patient has a new diagnosis.  Her A1c is elevated at 7.3.  She asked her PCP to start her on Mounjaro 2.5 mg and she started her first dose yesterday.  She is working on eating all of the food on her plan, but this may become difficult soon.  2. Vitamin D deficiency Patient has a new diagnosis.  Her vitamin D level is below goal and she notes fatigue.  3. Other hyperlipidemia Patient is on Crestor 5 mg, her HDL is low and LDL above goal for diabetes mellitus.  She denies chest pain or myalgias.  Assessment/Plan:   1. Type 2 diabetes mellitus with other specified complication, without long-term current use of insulin (HCC) Much of today's visit was spent on diabetes mellitus education and nutritional education.  We will follow-up at patient's next appointment in 2 weeks to assess how well she is doing.  2. Vitamin D deficiency Patient agreed to start prescription vitamin D 50,000 IU once weekly with no refills.  We will recheck labs in 3 months.  - Vitamin D, Ergocalciferol, (DRISDOL) 1.25 MG  (50000 UNIT) CAPS capsule; Take 1 capsule (50,000 Units total) by mouth every 7 (seven) days.  Dispense: 4 capsule; Refill: 0  3. Other hyperlipidemia Patient will continue Crestor at 5 mg, and will work on intensive nutritional therapy.  We will recheck labs in 3 months.  4. BMI 35.0-35.9,adult  5. Obesity with starting BMI of 36.1 Miranda Williams is currently in the action stage of change. As such, her goal is to continue with weight loss efforts. She has agreed to keeping a food journal and adhering to recommended goals of 1100-1300 calories and 85+ grams of protein daily.   Patient is to keep a strict food journal and work on meeting protein goals with "real food" and not so much with protein supplement foods.  Behavioral modification strategies: increasing lean protein intake and keeping a strict food journal.  Miranda Williams has agreed to follow-up with our clinic in 2 weeks. She was informed of the importance of frequent follow-up visits to maximize her success with intensive lifestyle modifications for her multiple health conditions.   Objective:   Blood pressure 107/62, pulse 78, temperature 98.3 F (36.8 C), height 5' 2.5" (1.588 m), weight 199 lb (90.3 kg), SpO2 96%. Body mass index is 35.82 kg/m.  Lab Results  Component Value Date   CREATININE 0.70 05/15/2023   BUN 13 05/15/2023   NA 135 05/15/2023   K 4.5 05/15/2023   CL 100 05/15/2023   CO2 20  05/15/2023   Lab Results  Component Value Date   ALT 20 05/15/2023   AST 16 05/15/2023   ALKPHOS 102 05/15/2023   BILITOT 0.3 05/15/2023   Lab Results  Component Value Date   HGBA1C 7.3 (H) 05/15/2023   HGBA1C 5.9 (H) 05/30/2019   Lab Results  Component Value Date   INSULIN 23.7 05/15/2023   Lab Results  Component Value Date   TSH 2.050 05/15/2023   Lab Results  Component Value Date   CHOL 168 05/15/2023   HDL 44 05/15/2023   LDLCALC 99 05/15/2023   TRIG 142 05/15/2023   CHOLHDL 3.4 07/05/2022   Lab Results  Component  Value Date   VD25OH 29.6 (L) 05/15/2023   VD25OH 27.1 (L) 05/30/2019   Lab Results  Component Value Date   WBC 8.3 05/15/2023   HGB 13.7 05/15/2023   HCT 41.0 05/15/2023   MCV 87 05/15/2023   PLT 293 05/15/2023   No results found for: "IRON", "TIBC", "FERRITIN"  Attestation Statements:   Reviewed by clinician on day of visit: allergies, medications, problem list, medical history, surgical history, family history, social history, and previous encounter notes.  Time spent on visit including pre-visit chart review and post-visit care and charting was 50 minutes.   I, Burt Knack, am acting as transcriptionist for Quillian Quince, MD.  I have reviewed the above documentation for accuracy and completeness, and I agree with the above. -  Quillian Quince, MD

## 2023-06-15 ENCOUNTER — Encounter (INDEPENDENT_AMBULATORY_CARE_PROVIDER_SITE_OTHER): Payer: Self-pay | Admitting: Family Medicine

## 2023-06-15 ENCOUNTER — Ambulatory Visit (INDEPENDENT_AMBULATORY_CARE_PROVIDER_SITE_OTHER): Payer: Commercial Managed Care - PPO | Admitting: Family Medicine

## 2023-06-15 ENCOUNTER — Other Ambulatory Visit: Payer: Self-pay

## 2023-06-15 VITALS — BP 117/75 | HR 66 | Temp 98.4°F | Ht 62.5 in | Wt 195.0 lb

## 2023-06-15 DIAGNOSIS — E669 Obesity, unspecified: Secondary | ICD-10-CM | POA: Diagnosis not present

## 2023-06-15 DIAGNOSIS — Z7985 Long-term (current) use of injectable non-insulin antidiabetic drugs: Secondary | ICD-10-CM

## 2023-06-15 DIAGNOSIS — E559 Vitamin D deficiency, unspecified: Secondary | ICD-10-CM

## 2023-06-15 DIAGNOSIS — E1169 Type 2 diabetes mellitus with other specified complication: Secondary | ICD-10-CM | POA: Diagnosis not present

## 2023-06-15 DIAGNOSIS — Z6835 Body mass index (BMI) 35.0-35.9, adult: Secondary | ICD-10-CM

## 2023-06-15 MED ORDER — VITAMIN D (ERGOCALCIFEROL) 1.25 MG (50000 UNIT) PO CAPS
50000.0000 [IU] | ORAL_CAPSULE | ORAL | 0 refills | Status: DC
  Filled 2023-06-15: qty 4, 28d supply, fill #0

## 2023-06-15 MED ORDER — TIRZEPATIDE 5 MG/0.5ML ~~LOC~~ SOAJ
5.0000 mg | SUBCUTANEOUS | 0 refills | Status: DC
Start: 2023-06-15 — End: 2023-06-29
  Filled 2023-06-15: qty 2, 28d supply, fill #0

## 2023-06-16 ENCOUNTER — Other Ambulatory Visit (HOSPITAL_COMMUNITY): Payer: Self-pay

## 2023-06-16 ENCOUNTER — Other Ambulatory Visit: Payer: Self-pay

## 2023-06-20 ENCOUNTER — Other Ambulatory Visit: Payer: Self-pay

## 2023-06-20 ENCOUNTER — Other Ambulatory Visit (HOSPITAL_COMMUNITY): Payer: Self-pay

## 2023-06-20 NOTE — Progress Notes (Signed)
Chief Complaint:   OBESITY Miranda Williams is here to discuss her progress with her obesity treatment plan along with follow-up of her obesity related diagnoses. Miranda Williams is on keeping a food journal and adhering to recommended goals of 1100-1300 calories and 85+ grams of protein and states she is following her eating plan approximately 90% of the time. Miranda Williams states she is doing 0 minutes 0 times per week.  Today's visit was #: 3 Starting weight: 201 lbs Starting date: 05/15/2023 Today's weight: 195 lbs Today's date: 06/15/2023 Total lbs lost to date: 6 Total lbs lost since last in-office visit: 4  Interim History: Patient continues to do well with her weight loss.  She is journaling and doing well with meeting her protein goals.  Subjective:   1. Type 2 diabetes mellitus with other specified complication, without long-term current use of insulin (HCC) Patient is on Mounjaro with no nausea or vomiting.  She still notes some polyphagia even soon after eating.  2. Vitamin D deficiency Patient is stable on vitamin D with no side effects noted.  Assessment/Plan:   1. Type 2 diabetes mellitus with other specified complication, without long-term current use of insulin (HCC) Patient agreed to increase Mounjaro to 5 mg once weekly, and we will refill for 1 month.  We will follow-up at her next visit.  - tirzepatide [facility]) 5 MG/0.5ML Pen; Inject 5 mg into the skin once a week.  Dispense: 2 mL; Refill: 0  2. Vitamin D deficiency Patient will continue prescription vitamin D, and we will refill for 1 month.  - Vitamin D, Ergocalciferol, (DRISDOL) 1.25 MG (50000 UNIT) CAPS capsule; Take 1 capsule (50,000 Units total) by mouth every 7 (seven) days.  Dispense: 4 capsule; Refill: 0  3. BMI 35.0-35.9,adult  4. Obesity, Beginning BMI 36.3 Miranda Williams is currently in the action stage of change. As such, her goal is to continue with weight loss efforts. She has agreed to keeping a food journal and adhering  to recommended goals of 1100-1300 calories and 85+ grams of protein daily.   Rest of the packet #1 was given.  Behavioral modification strategies: increasing lean protein intake.  Miranda Williams has agreed to follow-up with our clinic in 2 weeks. She was informed of the importance of frequent follow-up visits to maximize her success with intensive lifestyle modifications for her multiple health conditions.   Objective:   Blood pressure 117/75, pulse 66, temperature 98.4 F (36.9 C), height 5' 2.5" (1.588 m), weight 195 lb (88.5 kg), SpO2 97%. Body mass index is 35.1 kg/m.  Lab Results  Component Value Date   CREATININE 0.70 05/15/2023   BUN 13 05/15/2023   NA 135 05/15/2023   K 4.5 05/15/2023   CL 100 05/15/2023   CO2 20 05/15/2023   Lab Results  Component Value Date   ALT 20 05/15/2023   AST 16 05/15/2023   ALKPHOS 102 05/15/2023   BILITOT 0.3 05/15/2023   Lab Results  Component Value Date   HGBA1C 7.3 (H) 05/15/2023   HGBA1C 5.9 (H) 05/30/2019   Lab Results  Component Value Date   INSULIN 23.7 05/15/2023   Lab Results  Component Value Date   TSH 2.050 05/15/2023   Lab Results  Component Value Date   CHOL 168 05/15/2023   HDL 44 05/15/2023   LDLCALC 99 05/15/2023   TRIG 142 05/15/2023   CHOLHDL 3.4 07/05/2022   Lab Results  Component Value Date   VD25OH 29.6 (L) 05/15/2023   VD25OH 27.1 (  L) 05/30/2019   Lab Results  Component Value Date   WBC 8.3 05/15/2023   HGB 13.7 05/15/2023   HCT 41.0 05/15/2023   MCV 87 05/15/2023   PLT 293 05/15/2023   No results found for: "IRON", "TIBC", "FERRITIN"  Attestation Statements:   Reviewed by clinician on day of visit: allergies, medications, problem list, medical history, surgical history, family history, social history, and previous encounter notes.   I, Burt Knack, am acting as transcriptionist for Quillian Quince, MD.  I have reviewed the above documentation for accuracy and completeness, and I agree with the  above. -  Quillian Quince, MD

## 2023-06-21 DIAGNOSIS — E7849 Other hyperlipidemia: Secondary | ICD-10-CM | POA: Diagnosis not present

## 2023-06-21 DIAGNOSIS — R030 Elevated blood-pressure reading, without diagnosis of hypertension: Secondary | ICD-10-CM | POA: Diagnosis not present

## 2023-06-21 DIAGNOSIS — Z23 Encounter for immunization: Secondary | ICD-10-CM | POA: Diagnosis not present

## 2023-06-21 DIAGNOSIS — Z6836 Body mass index (BMI) 36.0-36.9, adult: Secondary | ICD-10-CM | POA: Diagnosis not present

## 2023-06-21 DIAGNOSIS — E1165 Type 2 diabetes mellitus with hyperglycemia: Secondary | ICD-10-CM | POA: Diagnosis not present

## 2023-06-21 DIAGNOSIS — F411 Generalized anxiety disorder: Secondary | ICD-10-CM | POA: Diagnosis not present

## 2023-06-22 ENCOUNTER — Other Ambulatory Visit (HOSPITAL_COMMUNITY): Payer: Self-pay

## 2023-06-22 ENCOUNTER — Other Ambulatory Visit: Payer: Self-pay

## 2023-06-22 MED ORDER — ROSUVASTATIN CALCIUM 5 MG PO TABS
5.0000 mg | ORAL_TABLET | Freq: Every evening | ORAL | 1 refills | Status: DC
  Filled 2023-06-22: qty 90, 90d supply, fill #0
  Filled 2023-08-01 – 2023-12-26 (×4): qty 90, 90d supply, fill #1

## 2023-06-22 MED ORDER — SPIRONOLACTONE 50 MG PO TABS
50.0000 mg | ORAL_TABLET | Freq: Every day | ORAL | 1 refills | Status: DC
  Filled 2023-08-01 – 2023-10-27 (×3): qty 90, 90d supply, fill #0
  Filled 2024-01-25: qty 90, 90d supply, fill #1

## 2023-06-22 MED ORDER — FLUOXETINE HCL 40 MG PO CAPS
40.0000 mg | ORAL_CAPSULE | Freq: Every day | ORAL | 1 refills | Status: DC
  Filled 2023-08-01 – 2023-10-27 (×3): qty 90, 90d supply, fill #0
  Filled 2024-01-25: qty 90, 90d supply, fill #1

## 2023-06-29 ENCOUNTER — Ambulatory Visit (INDEPENDENT_AMBULATORY_CARE_PROVIDER_SITE_OTHER): Payer: Commercial Managed Care - PPO | Admitting: Family Medicine

## 2023-06-29 ENCOUNTER — Encounter (INDEPENDENT_AMBULATORY_CARE_PROVIDER_SITE_OTHER): Payer: Self-pay | Admitting: Family Medicine

## 2023-06-29 VITALS — BP 104/68 | HR 60 | Temp 98.2°F | Ht 62.5 in | Wt 192.0 lb

## 2023-06-29 DIAGNOSIS — E559 Vitamin D deficiency, unspecified: Secondary | ICD-10-CM

## 2023-06-29 DIAGNOSIS — Z6834 Body mass index (BMI) 34.0-34.9, adult: Secondary | ICD-10-CM | POA: Diagnosis not present

## 2023-06-29 DIAGNOSIS — E119 Type 2 diabetes mellitus without complications: Secondary | ICD-10-CM | POA: Diagnosis not present

## 2023-06-29 DIAGNOSIS — Z7985 Long-term (current) use of injectable non-insulin antidiabetic drugs: Secondary | ICD-10-CM

## 2023-06-29 DIAGNOSIS — E1169 Type 2 diabetes mellitus with other specified complication: Secondary | ICD-10-CM

## 2023-06-29 DIAGNOSIS — E669 Obesity, unspecified: Secondary | ICD-10-CM | POA: Diagnosis not present

## 2023-06-29 MED ORDER — VITAMIN D (ERGOCALCIFEROL) 1.25 MG (50000 UNIT) PO CAPS
50000.0000 [IU] | ORAL_CAPSULE | ORAL | 0 refills | Status: DC
Start: 2023-06-29 — End: 2023-07-25
  Filled 2023-06-29 – 2023-07-14 (×2): qty 4, 28d supply, fill #0

## 2023-06-29 MED ORDER — TIRZEPATIDE 5 MG/0.5ML ~~LOC~~ SOAJ
5.0000 mg | SUBCUTANEOUS | 0 refills | Status: DC
  Filled 2023-06-29 – 2023-07-14 (×2): qty 2, 28d supply, fill #0

## 2023-06-29 NOTE — Progress Notes (Signed)
.smr  Office: [phone]  /  Fax: [phone]  WEIGHT SUMMARY AND BIOMETRICS  Anthropometric Measurements Height: 5' 2.5" (1.588 m) Weight: 192 lb (87.1 kg) BMI (Calculated): 34.54 Weight at Last Visit: 195 lb Weight Lost Since Last Visit: 3 lb Weight Gained Since Last Visit: 0 Starting Weight: 201 lb Total Weight Loss (lbs): 9 lb (4.082 kg) Peak Weight: 220 lb   Body Composition  Body Fat %: 41 % Fat Mass (lbs): 79 lbs Muscle Mass (lbs): 107.8 lbs Total Body Water (lbs): 76.6 lbs Visceral Fat Rating : 11   Other Clinical Data Fasting: No Labs: No Today's Visit #: 4 Starting Date: 05/15/23    Chief Complaint: OBESITY   History of Present Illness   The patient, diagnosed with obesity, vitamin D deficiency, and type 2 diabetes, presents for a follow-up visit. She reports a weight loss of 3 pounds over the past two weeks, achieved through maintaining a food journal and consuming approximately 1300 calories and 85 grams of protein daily. She estimates adherence to this dietary regimen about 90% of the time. However, she is not currently engaging in any exercise.  Regarding her diabetes management, she is on a weekly dose of 5 mg Mounjaro. She has taken one dose so far and is due for the second dose in the coming week. She expresses concerns about her ability to consume adequate protein due to decreased hunger, possibly a side effect of the Mounjaro.  The patient also mentions an upcoming beach vacation and anticipates challenges with maintaining her dietary regimen, particularly during meals out. She expresses a desire to continue her weight loss progress during this period. She also inquires about protein-rich coffee drink options as a potential addition to her diet.  The patient is not currently experiencing any other health issues. She is due for a refill of her Mounjaro and vitamin D prescriptions.          PHYSICAL EXAM:  Blood pressure 104/68, pulse 60,  temperature 98.2 F (36.8 C), height 5' 2.5" (1.588 m), weight 192 lb (87.1 kg), SpO2 95%. Body mass index is 34.56 kg/m.  DIAGNOSTIC DATA REVIEWED:  BMET    Component Value Date/Time   NA 135 05/15/2023 0956   K 4.5 05/15/2023 0956   CL 100 05/15/2023 0956   CO2 20 05/15/2023 0956   GLUCOSE 173 (H) 05/15/2023 0956   GLUCOSE 162 (H) 08/10/2022 0819   BUN 13 05/15/2023 0956   CREATININE 0.70 05/15/2023 0956   CREATININE 0.59 12/21/2020 1421   CALCIUM 9.4 05/15/2023 0956   GFRNONAA >60 08/10/2022 0819   GFRAA >60 05/30/2019 0818   Lab Results  Component Value Date   HGBA1C 7.3 (H) 05/15/2023   HGBA1C 5.9 (H) 05/30/2019   Lab Results  Component Value Date   INSULIN 23.7 05/15/2023   Lab Results  Component Value Date   TSH 2.050 05/15/2023   CBC    Component Value Date/Time   WBC 8.3 05/15/2023 0956   WBC 7.7 12/21/2020 1421   RBC 4.71 05/15/2023 0956   RBC 4.31 12/21/2020 1421   HGB 13.7 05/15/2023 0956   HCT 41.0 05/15/2023 0956   PLT 293 05/15/2023 0956   MCV 87 05/15/2023 0956   MCH 29.1 05/15/2023 0956   MCH 29.5 12/21/2020 1421   MCHC 33.4 05/15/2023 0956   MCHC 34.1 12/21/2020 1421   RDW 11.9 05/15/2023 0956   Iron Studies No results found for: "IRON", "TIBC", "FERRITIN", "IRONPCTSAT" Lipid Panel     Component  Value Date/Time   CHOL 168 05/15/2023 0956   TRIG 142 05/15/2023 0956   HDL 44 05/15/2023 0956   CHOLHDL 3.4 07/05/2022 1601   CHOLHDL 3.4 12/21/2020 1421   VLDL 14 05/30/2019 0819   LDLCALC 99 05/15/2023 0956   LDLCALC 85 12/21/2020 1421   Hepatic Function Panel     Component Value Date/Time   PROT 6.9 05/15/2023 0956   ALBUMIN 4.4 05/15/2023 0956   AST 16 05/15/2023 0956   ALT 20 05/15/2023 0956   ALKPHOS 102 05/15/2023 0956   BILITOT 0.3 05/15/2023 0956      Component Value Date/Time   TSH 2.050 05/15/2023 0956   Nutritional Lab Results  Component Value Date   VD25OH 29.6 (L) 05/15/2023   VD25OH 27.1 (L) 05/30/2019      Assessment and Plan    Obesity 3-pound weight loss over the past two weeks with a diet of approximately 1300 calories and 85 grams of protein daily. Discussed the importance of consistent food journaling and introduced a new app for meal planning. -Continue current dietary plan and food journaling. -Consider using the ChatGPT app for meal planning.  Vitamin D Deficiency On prescription Vitamin D. -Refill Vitamin D prescription.  Type 2 Diabetes On Mounjaro 5mg  weekly. Discussed the importance of consistent dosing and potential effects on appetite. -Continue Mounjaro 5mg  weekly. -Refill Mounjaro prescription. -Check labs at next visit to assess blood sugar control.       She was informed of the importance of frequent follow up visits to maximize her success with intensive lifestyle modifications for her multiple health conditions.    Quillian Quince, MD

## 2023-06-30 ENCOUNTER — Other Ambulatory Visit (HOSPITAL_COMMUNITY): Payer: Self-pay

## 2023-07-11 ENCOUNTER — Other Ambulatory Visit (HOSPITAL_COMMUNITY): Payer: Self-pay | Admitting: Family Medicine

## 2023-07-11 DIAGNOSIS — Z1231 Encounter for screening mammogram for malignant neoplasm of breast: Secondary | ICD-10-CM

## 2023-07-14 ENCOUNTER — Other Ambulatory Visit: Payer: Self-pay

## 2023-07-17 ENCOUNTER — Other Ambulatory Visit (HOSPITAL_COMMUNITY): Payer: Self-pay

## 2023-07-24 ENCOUNTER — Ambulatory Visit (HOSPITAL_COMMUNITY)
Admission: RE | Admit: 2023-07-24 | Discharge: 2023-07-24 | Disposition: A | Discharge status: Home / Self Care | Payer: Commercial Managed Care - PPO | Source: Ambulatory Visit | Attending: Family Medicine | Admitting: Family Medicine

## 2023-07-24 ENCOUNTER — Encounter (HOSPITAL_COMMUNITY): Payer: Self-pay

## 2023-07-24 DIAGNOSIS — Z1231 Encounter for screening mammogram for malignant neoplasm of breast: Secondary | ICD-10-CM | POA: Insufficient documentation

## 2023-07-25 ENCOUNTER — Encounter (INDEPENDENT_AMBULATORY_CARE_PROVIDER_SITE_OTHER): Payer: Self-pay | Admitting: Family Medicine

## 2023-07-25 ENCOUNTER — Ambulatory Visit (INDEPENDENT_AMBULATORY_CARE_PROVIDER_SITE_OTHER): Payer: Commercial Managed Care - PPO | Admitting: Family Medicine

## 2023-07-25 ENCOUNTER — Other Ambulatory Visit (HOSPITAL_COMMUNITY): Payer: Self-pay

## 2023-07-25 VITALS — BP 101/61 | HR 69 | Temp 97.8°F | Ht 62.5 in | Wt 191.0 lb

## 2023-07-25 DIAGNOSIS — E669 Obesity, unspecified: Secondary | ICD-10-CM

## 2023-07-25 DIAGNOSIS — E559 Vitamin D deficiency, unspecified: Secondary | ICD-10-CM | POA: Diagnosis not present

## 2023-07-25 DIAGNOSIS — Z7985 Long-term (current) use of injectable non-insulin antidiabetic drugs: Secondary | ICD-10-CM

## 2023-07-25 DIAGNOSIS — Z6834 Body mass index (BMI) 34.0-34.9, adult: Secondary | ICD-10-CM | POA: Diagnosis not present

## 2023-07-25 DIAGNOSIS — E1169 Type 2 diabetes mellitus with other specified complication: Secondary | ICD-10-CM | POA: Diagnosis not present

## 2023-07-25 MED ORDER — TIRZEPATIDE 5 MG/0.5ML ~~LOC~~ SOAJ
5.0000 mg | SUBCUTANEOUS | 0 refills | Status: DC
  Filled 2023-07-25 – 2023-08-10 (×3): qty 2, 28d supply, fill #0

## 2023-07-25 MED ORDER — VITAMIN D (ERGOCALCIFEROL) 1.25 MG (50000 UNIT) PO CAPS
50000.0000 [IU] | ORAL_CAPSULE | ORAL | 0 refills | Status: DC
  Filled 2023-07-25 – 2023-08-10 (×4): qty 4, 28d supply, fill #0

## 2023-07-25 NOTE — Progress Notes (Unsigned)
Chief Complaint:   OBESITY Miranda Williams is here to discuss her progress with her obesity treatment plan along with follow-up of her obesity related diagnoses. Miranda Williams is on keeping a food journal and adhering to recommended goals of 1100-1300 calories and 85+ grams of protein and states she is following her eating plan approximately 50% of the time. Miranda Williams states she is doing 0 minutes 0 times per week.  Today's visit was #: 5 Starting weight: 201 lbs Starting date: 05/15/2023 Today's weight: 191 lbs Today's date: 07/25/2023 Total lbs lost to date: 10 Total lbs lost since last in-office visit: 1  Interim History: Patient has been on vacation and she is doing well with her weight loss despite extra temptations.  Her hunger is mostly controlled.  Subjective:   1. Type 2 diabetes mellitus with other specified complication, without long-term current use of insulin (HCC) Patient is working on her diet and weight loss, and she is tolerating Mounjaro well with decreased GI upset.  2. Vitamin D deficiency Patient is on vitamin D prescription, and her vitamin D level is not yet at goal but should be improving.  Assessment/Plan:   1. Type 2 diabetes mellitus with other specified complication, without long-term current use of insulin (HCC) Patient will continue Mounjaro 5 mg once weekly, and we will refill for 1 month.  - tirzepatide [facility]) 5 MG/0.5ML Pen; Inject 5 mg into the skin once a week.  Dispense: 2 mL; Refill: 0  2. Vitamin D deficiency Patient will continue prescription vitamin D once weekly, and we will refill for 1 month.  - Vitamin D, Ergocalciferol, (DRISDOL) 1.25 MG (50000 UNIT) CAPS capsule; Take 1 capsule (50,000 Units total) by mouth every 7 (seven) days.  Dispense: 4 capsule; Refill: 0  3. BMI 34.0-34.9,adult  4. Obesity, Beginning BMI 36.3 Miranda Williams is currently in the action stage of change. As such, her goal is to continue with weight loss efforts. She has agreed to  keeping a food journal and adhering to recommended goals of 1300 calories and 85+ grams of protein daily.   Exercise goals: Cardio for 10 minutes 3 times per week.   Behavioral modification strategies: increasing lean protein intake.  Miranda Williams has agreed to follow-up with our clinic in 3 weeks. She was informed of the importance of frequent follow-up visits to maximize her success with intensive lifestyle modifications for her multiple health conditions.   Objective:   Blood pressure 101/61, pulse 69, temperature 97.8 F (36.6 C), height 5' 2.5" (1.588 m), weight 191 lb (86.6 kg), SpO2 98%. Body mass index is 34.38 kg/m.  Lab Results  Component Value Date   CREATININE 0.70 05/15/2023   BUN 13 05/15/2023   NA 135 05/15/2023   K 4.5 05/15/2023   CL 100 05/15/2023   CO2 20 05/15/2023   Lab Results  Component Value Date   ALT 20 05/15/2023   AST 16 05/15/2023   ALKPHOS 102 05/15/2023   BILITOT 0.3 05/15/2023   Lab Results  Component Value Date   HGBA1C 7.3 (H) 05/15/2023   HGBA1C 5.9 (H) 05/30/2019   Lab Results  Component Value Date   INSULIN 23.7 05/15/2023   Lab Results  Component Value Date   TSH 2.050 05/15/2023   Lab Results  Component Value Date   CHOL 168 05/15/2023   HDL 44 05/15/2023   LDLCALC 99 05/15/2023   TRIG 142 05/15/2023   CHOLHDL 3.4 07/05/2022   Lab Results  Component Value Date   VD25OH  29.6 (L) 05/15/2023   VD25OH 27.1 (L) 05/30/2019   Lab Results  Component Value Date   WBC 8.3 05/15/2023   HGB 13.7 05/15/2023   HCT 41.0 05/15/2023   MCV 87 05/15/2023   PLT 293 05/15/2023   No results found for: "IRON", "TIBC", "FERRITIN"  Attestation Statements:   Reviewed by clinician on day of visit: allergies, medications, problem list, medical history, surgical history, family history, social history, and previous encounter notes.   I, Burt Knack, am acting as transcriptionist for Quillian Quince, MD.  I have reviewed the above  documentation for accuracy and completeness, and I agree with the above. -  Quillian Quince, MD

## 2023-07-26 ENCOUNTER — Other Ambulatory Visit (HOSPITAL_COMMUNITY): Payer: Self-pay

## 2023-08-01 ENCOUNTER — Other Ambulatory Visit: Payer: Self-pay

## 2023-08-10 ENCOUNTER — Other Ambulatory Visit: Payer: Self-pay

## 2023-08-15 ENCOUNTER — Encounter (INDEPENDENT_AMBULATORY_CARE_PROVIDER_SITE_OTHER): Payer: Self-pay | Admitting: Family Medicine

## 2023-08-15 ENCOUNTER — Ambulatory Visit (INDEPENDENT_AMBULATORY_CARE_PROVIDER_SITE_OTHER): Payer: Commercial Managed Care - PPO | Admitting: Family Medicine

## 2023-08-15 ENCOUNTER — Other Ambulatory Visit (HOSPITAL_COMMUNITY): Payer: Self-pay

## 2023-08-15 VITALS — BP 108/72 | HR 92 | Temp 97.6°F | Ht 62.5 in | Wt 189.0 lb

## 2023-08-15 DIAGNOSIS — E1169 Type 2 diabetes mellitus with other specified complication: Secondary | ICD-10-CM | POA: Diagnosis not present

## 2023-08-15 DIAGNOSIS — Z7985 Long-term (current) use of injectable non-insulin antidiabetic drugs: Secondary | ICD-10-CM | POA: Diagnosis not present

## 2023-08-15 DIAGNOSIS — F5089 Other specified eating disorder: Secondary | ICD-10-CM

## 2023-08-15 DIAGNOSIS — E669 Obesity, unspecified: Secondary | ICD-10-CM | POA: Diagnosis not present

## 2023-08-15 DIAGNOSIS — Z6834 Body mass index (BMI) 34.0-34.9, adult: Secondary | ICD-10-CM | POA: Diagnosis not present

## 2023-08-15 DIAGNOSIS — E559 Vitamin D deficiency, unspecified: Secondary | ICD-10-CM

## 2023-08-15 DIAGNOSIS — F3289 Other specified depressive episodes: Secondary | ICD-10-CM

## 2023-08-15 MED ORDER — TIRZEPATIDE 5 MG/0.5ML ~~LOC~~ SOAJ
5.0000 mg | SUBCUTANEOUS | 0 refills | Status: DC
  Filled 2023-08-15: qty 2, 28d supply, fill #0

## 2023-08-15 MED ORDER — TIRZEPATIDE 7.5 MG/0.5ML ~~LOC~~ SOAJ
7.5000 mg | SUBCUTANEOUS | 0 refills | Status: DC
Start: 2023-08-15 — End: 2023-09-04
  Filled 2023-08-15: qty 6, 84d supply, fill #0

## 2023-08-15 MED ORDER — VITAMIN D (ERGOCALCIFEROL) 1.25 MG (50000 UNIT) PO CAPS
50000.0000 [IU] | ORAL_CAPSULE | ORAL | 0 refills | Status: DC
  Filled 2023-08-15: qty 4, 28d supply, fill #0

## 2023-08-15 NOTE — Progress Notes (Signed)
Chief Complaint:   OBESITY Miranda Williams is here to discuss her progress with her obesity treatment plan along with follow-up of her obesity related diagnoses. [name] is on keeping a food journal and adhering to recommended goals of 1300 calories and 85+ grams of protein and states she is following her eating plan approximately 60% of the time. [name] states she is riding the stationary bike for 10 minutes 2 times per week.  Today's visit was #: 6 Starting weight: 201 lbs Starting date: 05/15/2023 Today's weight: 189 lbs Today's date: 08/15/2023 Total lbs lost to date: 12 Total lbs lost since last in-office visit: 2  Interim History: Patient continues to do well with her weight loss despite some extra challenges.  She notes some increase in sweet cravings after lunch and dinner.  Subjective:   1. Type 2 diabetes mellitus with other specified complication, without long-term current use of insulin (HCC) Patient is working on her diet and weight loss to control her diabetes mellitus.  No side effects were noted on Mounjaro, but polyphagia has started to increase.  2. Vitamin D deficiency Patient is stable on vitamin D, and she denies nausea, vomiting, or muscle weakness.  3. Emotional Eating Behavior Patient notes an increase in emotional eating behavior.  There has been increased stress at work and she has interrupted sleep by her dogs multiple times per night.  Assessment/Plan:   1. Type 2 diabetes mellitus with other specified complication, without long-term current use of insulin (HCC) Patient agreed to increase Mounjaro to 7.5 mg once weekly, and we will refill for 90 days.  We will follow-up at her next visit in 3 weeks.  - tirzepatide (MOUNJARO) 7.5 MG/0.5ML Pen; Inject 7.5 mg into the skin once a week. Dispense: 6 mL; Refill: 0   2. Vitamin D deficiency Patient will continue prescription vitamin D 50,000 IU once weekly, we will refill for 1 month.  We will recheck labs in 1 to 2  months.  - Vitamin D, Ergocalciferol, (DRISDOL) 1.25 MG (50000 UNIT) CAPS capsule; Take 1 capsule (50,000 Units total) by mouth every 7 (seven) days.  Dispense: 4 capsule; Refill: 0  3. Emotional Eating Behavior Emotional eating behavior strategies and mechanisms were discussed.  Patient will try these strategies, and we will follow-up in 3 weeks.  4. BMI 34.0-34.9,adult  5. Obesity, Beginning BMI 36.3 [name] is currently in the action stage of change. As such, her goal is to continue with weight loss efforts. She has agreed to keeping a food journal and adhering to recommended goals of 1300 calories and 85+ grams of protein daily.   Exercise goals: As is.   Behavioral modification strategies: increasing lean protein intake.  [name] has agreed to follow-up with our clinic in 3 weeks. She was informed of the importance of frequent follow-up visits to maximize her success with intensive lifestyle modifications for her multiple health conditions.   Objective:   Blood pressure 108/72, pulse 92, temperature 97.6 F (36.4 C), height 5' 2.5" (1.588 m), weight 189 lb (85.7 kg), SpO2 98%. Body mass index is 34.02 kg/m.  Lab Results  Component Value Date   CREATININE 0.70 05/15/2023   BUN 13 05/15/2023   NA 135 05/15/2023   K 4.5 05/15/2023   CL 100 05/15/2023   CO2 20 05/15/2023   Lab Results  Component Value Date   ALT 20 05/15/2023   AST 16 05/15/2023   ALKPHOS 102 05/15/2023   BILITOT 0.3 05/15/2023   Lab Results  Component Value Date   HGBA1C 7.3 (H) 05/15/2023   HGBA1C 5.9 (H) 05/30/2019   Lab Results  Component Value Date   INSULIN 23.7 05/15/2023   Lab Results  Component Value Date   TSH 2.050 05/15/2023   Lab Results  Component Value Date   CHOL 168 05/15/2023   HDL 44 05/15/2023   LDLCALC 99 05/15/2023   TRIG 142 05/15/2023   CHOLHDL 3.4 07/05/2022   Lab Results  Component Value Date   VD25OH 29.6 (L) 05/15/2023   VD25OH 27.1 (L) 05/30/2019   Lab  Results  Component Value Date   WBC 8.3 05/15/2023   HGB 13.7 05/15/2023   HCT 41.0 05/15/2023   MCV 87 05/15/2023   PLT 293 05/15/2023   No results found for: "IRON", "TIBC", "FERRITIN"  Attestation Statements:   Reviewed by clinician on day of visit: allergies, medications, problem list, medical history, surgical history, family history, social history, and previous encounter notes.   I, Burt Knack, am acting as transcriptionist for Quillian Quince, MD.  I have reviewed the above documentation for accuracy and completeness, and I agree with the above. -  Quillian Quince, MD

## 2023-08-16 ENCOUNTER — Other Ambulatory Visit: Payer: Self-pay

## 2023-08-17 ENCOUNTER — Other Ambulatory Visit (HOSPITAL_COMMUNITY): Payer: Self-pay

## 2023-09-04 ENCOUNTER — Other Ambulatory Visit: Payer: Self-pay

## 2023-09-04 ENCOUNTER — Encounter (INDEPENDENT_AMBULATORY_CARE_PROVIDER_SITE_OTHER): Payer: Self-pay | Admitting: Family Medicine

## 2023-09-04 ENCOUNTER — Ambulatory Visit (INDEPENDENT_AMBULATORY_CARE_PROVIDER_SITE_OTHER): Payer: Commercial Managed Care - PPO | Admitting: Family Medicine

## 2023-09-04 VITALS — BP 107/57 | HR 64 | Temp 98.1°F | Ht 62.5 in | Wt 189.0 lb

## 2023-09-04 DIAGNOSIS — Z6834 Body mass index (BMI) 34.0-34.9, adult: Secondary | ICD-10-CM | POA: Diagnosis not present

## 2023-09-04 DIAGNOSIS — E559 Vitamin D deficiency, unspecified: Secondary | ICD-10-CM

## 2023-09-04 DIAGNOSIS — E669 Obesity, unspecified: Secondary | ICD-10-CM

## 2023-09-04 DIAGNOSIS — E1169 Type 2 diabetes mellitus with other specified complication: Secondary | ICD-10-CM

## 2023-09-04 DIAGNOSIS — Z7985 Long-term (current) use of injectable non-insulin antidiabetic drugs: Secondary | ICD-10-CM | POA: Diagnosis not present

## 2023-09-04 MED ORDER — VITAMIN D (ERGOCALCIFEROL) 1.25 MG (50000 UNIT) PO CAPS
50000.0000 [IU] | ORAL_CAPSULE | ORAL | 0 refills | Status: DC
Start: 2023-09-04 — End: 2024-04-30
  Filled 2023-09-04: qty 4, 28d supply, fill #0

## 2023-09-04 MED ORDER — TIRZEPATIDE 7.5 MG/0.5ML ~~LOC~~ SOAJ
7.5000 mg | SUBCUTANEOUS | 0 refills | Status: DC
  Filled 2023-09-04: qty 2, 28d supply, fill #0
  Filled 2023-09-07: qty 2, 28d supply, fill #1

## 2023-09-04 NOTE — Progress Notes (Unsigned)
.smr  Office: [phone]  /  Fax: [phone]  WEIGHT SUMMARY AND BIOMETRICS  Anthropometric Measurements Height: 5' 2.5" (1.588 m) Weight: 189 lb (85.7 kg) BMI (Calculated): 34 Weight at Last Visit: 189 lb Weight Lost Since Last Visit: 0 Weight Gained Since Last Visit: 0 Starting Weight: 201 lb Total Weight Loss (lbs): 12 lb (5.443 kg) Peak Weight: 220 lb   Body Composition  Body Fat %: 43.1 % Fat Mass (lbs): 81.8 lbs Muscle Mass (lbs): 102.4 lbs Total Body Water (lbs): 78.4 lbs Visceral Fat Rating : 11   Other Clinical Data Fasting: No Labs: No Today's Visit #: 7 Starting Date: 05/15/23    Chief Complaint: OBESITY    History of Present Illness   The patient, diagnosed with obesity, type two diabetes, and vitamin D deficiency, presents for a routine follow-up. She reports maintaining her weight over the past two weeks, with no weight gain or loss. She has been attempting to adhere to a diet of 1100-1300 calories and 85 grams of protein daily, achieving this approximately 50% of the time. She is currently on prescription vitamin D 50,000 international units weekly for vitamin D deficiency and Mounjaro 7.5 mg weekly for type two diabetes. Her last hemoglobin A1c was 7.3, indicating suboptimal control of her diabetes.  The patient has been experiencing challenges with meal planning, particularly when participating in social activities where appropriate food options are not readily available. She has also reported occasional episodes of feeling like she is having low blood sugar, typically when it is time for her to eat a meal or snack.  She has also been experimenting with incorporating more protein into her meals, such as adding cottage cheese to her eggs. She has been trying to maintain a healthy diet despite some struggles.          PHYSICAL EXAM:  Blood pressure (!) 107/57, pulse 64, temperature 98.1 F (36.7 C), height 5' 2.5" (1.588 m), weight 189 lb (85.7  kg), SpO2 97%. Body mass index is 34.02 kg/m.  DIAGNOSTIC DATA REVIEWED:  BMET    Component Value Date/Time   NA 135 05/15/2023 0956   K 4.5 05/15/2023 0956   CL 100 05/15/2023 0956   CO2 20 05/15/2023 0956   GLUCOSE 173 (H) 05/15/2023 0956   GLUCOSE 162 (H) 08/10/2022 0819   BUN 13 05/15/2023 0956   CREATININE 0.70 05/15/2023 0956   CREATININE 0.59 12/21/2020 1421   CALCIUM 9.4 05/15/2023 0956   GFRNONAA >60 08/10/2022 0819   GFRAA >60 05/30/2019 0818   Lab Results  Component Value Date   HGBA1C 7.3 (H) 05/15/2023   HGBA1C 5.9 (H) 05/30/2019   Lab Results  Component Value Date   INSULIN 23.7 05/15/2023   Lab Results  Component Value Date   TSH 2.050 05/15/2023   CBC    Component Value Date/Time   WBC 8.3 05/15/2023 0956   WBC 7.7 12/21/2020 1421   RBC 4.71 05/15/2023 0956   RBC 4.31 12/21/2020 1421   HGB 13.7 05/15/2023 0956   HCT 41.0 05/15/2023 0956   PLT 293 05/15/2023 0956   MCV 87 05/15/2023 0956   MCH 29.1 05/15/2023 0956   MCH 29.5 12/21/2020 1421   MCHC 33.4 05/15/2023 0956   MCHC 34.1 12/21/2020 1421   RDW 11.9 05/15/2023 0956   Iron Studies No results found for: "IRON", "TIBC", "FERRITIN", "IRONPCTSAT" Lipid Panel     Component Value Date/Time   CHOL 168 05/15/2023 0956   TRIG 142 05/15/2023 0956  HDL 44 05/15/2023 0956   CHOLHDL 3.4 07/05/2022 1601   CHOLHDL 3.4 12/21/2020 1421   VLDL 14 05/30/2019 0819   LDLCALC 99 05/15/2023 0956   LDLCALC 85 12/21/2020 1421   Hepatic Function Panel     Component Value Date/Time   PROT 6.9 05/15/2023 0956   ALBUMIN 4.4 05/15/2023 0956   AST 16 05/15/2023 0956   ALT 20 05/15/2023 0956   ALKPHOS 102 05/15/2023 0956   BILITOT 0.3 05/15/2023 0956      Component Value Date/Time   TSH 2.050 05/15/2023 0956   Nutritional Lab Results  Component Value Date   VD25OH 29.6 (L) 05/15/2023   VD25OH 27.1 (L) 05/30/2019     Assessment and Plan    Obesity Maintained weight over the last two  weeks. Struggling with meal planning and maintaining a consistent diet of 1100-1300 calories and 85+ grams of protein. Discussed strategies for managing social situations and meal planning. -Continue food journaling and aim for 1100-1300 calories and 85+ grams of protein daily. -Implement strategies discussed for managing social situations and meal planning. -Start walking for exercise, 10 minutes three times a week.  Type 2 Diabetes Last HbA1c was 7.3, indicating suboptimal control. Currently on Mounjaro 7.5mg  weekly. No issues reported with medication. -Continue Mounjaro 7.5mg  weekly. -Check HbA1c by end of the year.  Vitamin D Deficiency On prescription Vitamin D 50,000 international units weekly. Requested refill. -Refill prescription for Vitamin D 50,000 international units weekly. -Check labs to monitor progress.  Follow-up Next appointment scheduled for 09/21/2023 to discuss strategies for managing diet during Thanksgiving.        She was informed of the importance of frequent follow up visits to maximize her success with intensive lifestyle modifications for her multiple health conditions.    Quillian Quince, MD

## 2023-09-05 ENCOUNTER — Ambulatory Visit (INDEPENDENT_AMBULATORY_CARE_PROVIDER_SITE_OTHER): Payer: Commercial Managed Care - PPO | Admitting: Family Medicine

## 2023-09-05 ENCOUNTER — Other Ambulatory Visit (INDEPENDENT_AMBULATORY_CARE_PROVIDER_SITE_OTHER): Payer: Self-pay | Admitting: Family Medicine

## 2023-09-05 DIAGNOSIS — E559 Vitamin D deficiency, unspecified: Secondary | ICD-10-CM

## 2023-09-07 ENCOUNTER — Other Ambulatory Visit: Payer: Self-pay

## 2023-09-14 ENCOUNTER — Other Ambulatory Visit (HOSPITAL_COMMUNITY)
Admission: RE | Admit: 2023-09-14 | Discharge: 2023-09-14 | Disposition: A | Discharge status: Home / Self Care | Payer: Commercial Managed Care - PPO | Source: Ambulatory Visit | Attending: Family Medicine | Admitting: Family Medicine

## 2023-09-14 ENCOUNTER — Encounter (INDEPENDENT_AMBULATORY_CARE_PROVIDER_SITE_OTHER): Payer: Self-pay | Admitting: Family Medicine

## 2023-09-14 DIAGNOSIS — E559 Vitamin D deficiency, unspecified: Secondary | ICD-10-CM | POA: Diagnosis not present

## 2023-09-14 DIAGNOSIS — E1169 Type 2 diabetes mellitus with other specified complication: Secondary | ICD-10-CM | POA: Insufficient documentation

## 2023-09-14 LAB — COMPREHENSIVE METABOLIC PANEL
ALT: 35 U/L (ref 0–44)
AST: 25 U/L (ref 15–41)
Albumin: 4.2 g/dL (ref 3.5–5.0)
Alkaline Phosphatase: 71 U/L (ref 38–126)
Anion gap: 11 (ref 5–15)
BUN: 19 mg/dL (ref 6–20)
CO2: 22 mmol/L (ref 22–32)
Calcium: 8.9 mg/dL (ref 8.9–10.3)
Chloride: 102 mmol/L (ref 98–111)
Creatinine, Ser: 0.77 mg/dL (ref 0.44–1.00)
GFR, Estimated: >60 mL/min (ref >60)
Glucose, Bld: 102 mg/dL — ABNORMAL HIGH (ref 70–99)
Potassium: 4.2 mmol/L (ref 3.5–5.1)
Sodium: 135 mmol/L (ref 135–145)
Total Bilirubin: 0.7 mg/dL (ref <1.2)
Total Protein: 7.4 g/dL (ref 6.5–8.1)

## 2023-09-14 LAB — VITAMIN D 25 HYDROXY (VIT D DEFICIENCY, FRACTURES): Vit D, 25-Hydroxy: 107.88 ng/mL — ABNORMAL HIGH (ref 30–100)

## 2023-09-14 LAB — HEMOGLOBIN A1C
Hgb A1c MFr Bld: 5.7 % — ABNORMAL HIGH (ref 4.8–5.6)
Mean Plasma Glucose: 116.89 mg/dL

## 2023-09-15 LAB — INSULIN, RANDOM: Insulin: 26.9 u[IU]/mL — ABNORMAL HIGH (ref 2.6–24.9)

## 2023-09-20 ENCOUNTER — Other Ambulatory Visit (HOSPITAL_COMMUNITY): Payer: Self-pay

## 2023-09-21 ENCOUNTER — Encounter (INDEPENDENT_AMBULATORY_CARE_PROVIDER_SITE_OTHER): Payer: Self-pay | Admitting: Family Medicine

## 2023-09-21 ENCOUNTER — Ambulatory Visit (INDEPENDENT_AMBULATORY_CARE_PROVIDER_SITE_OTHER): Payer: Commercial Managed Care - PPO | Admitting: Family Medicine

## 2023-09-21 VITALS — BP 103/64 | HR 70 | Temp 98.1°F | Ht 62.5 in | Wt 186.0 lb

## 2023-09-21 DIAGNOSIS — E1169 Type 2 diabetes mellitus with other specified complication: Secondary | ICD-10-CM

## 2023-09-21 DIAGNOSIS — Z6833 Body mass index (BMI) 33.0-33.9, adult: Secondary | ICD-10-CM

## 2023-09-21 DIAGNOSIS — E66812 Obesity, class 2: Secondary | ICD-10-CM

## 2023-09-21 DIAGNOSIS — E669 Obesity, unspecified: Secondary | ICD-10-CM | POA: Diagnosis not present

## 2023-09-21 DIAGNOSIS — E559 Vitamin D deficiency, unspecified: Secondary | ICD-10-CM

## 2023-09-21 DIAGNOSIS — Z7985 Long-term (current) use of injectable non-insulin antidiabetic drugs: Secondary | ICD-10-CM

## 2023-09-21 DIAGNOSIS — E119 Type 2 diabetes mellitus without complications: Secondary | ICD-10-CM | POA: Diagnosis not present

## 2023-09-21 MED ORDER — TIRZEPATIDE 7.5 MG/0.5ML ~~LOC~~ SOAJ
7.5000 mg | SUBCUTANEOUS | 0 refills | Status: DC
  Filled 2023-09-21 – 2023-09-25 (×2): qty 6, 84d supply, fill #0
  Filled 2023-09-29: qty 2, 28d supply, fill #0

## 2023-09-21 NOTE — Progress Notes (Signed)
.smr  Office: [phone]  /  Fax: [phone]  WEIGHT SUMMARY AND BIOMETRICS  Anthropometric Measurements Height: 5' 2.5" (1.588 m) Weight: 186 lb (84.4 kg) BMI (Calculated): 33.46 Weight at Last Visit: 189 lb Weight Lost Since Last Visit: 3 lb Weight Gained Since Last Visit: 0 Starting Weight: 201 lb Total Weight Loss (lbs): 15 lb (6.804 kg) Peak Weight: 220 lb   Body Composition  Body Fat %: 41.7 % Fat Mass (lbs): 77.8 lbs Muscle Mass (lbs): 103.4 lbs Total Body Water (lbs): 74.6 lbs Visceral Fat Rating : 11   Other Clinical Data Fasting: no Labs: no Today's Visit #: 8 Starting Date: 05/15/23    Chief Complaint: OBESITY    History of Present Illness   The patient, diagnosed with type two diabetes and obesity, reports a recent weight loss of three pounds over the past two weeks. Despite attempts to maintain a diet of under 1300 calories with at least 85mg  of protein, she feels successful only about 10% of the time. The patient is not currently engaging in any exercise regimen.  The patient admits to struggling with dietary control, particularly in the face of free food at work. Despite this, she has been able to practice portion control and has noticed the effects of an increased medication dosage.  The patient has been experiencing increased fatigue over the past two weeks, despite getting adequate sleep. She is unsure of the cause, suggesting it could be due to the recent time change.  The patient also reports a recent episode of overeating at a family event, expressing guilt over the incident. Despite this, her overall mood remains positive and she expresses a desire to get back on track with her diet.          PHYSICAL EXAM:  Blood pressure 103/64, pulse 70, temperature 98.1 F (36.7 C), height 5' 2.5" (1.588 m), weight 186 lb (84.4 kg), SpO2 98%. Body mass index is 33.48 kg/m.  DIAGNOSTIC DATA REVIEWED:  BMET    Component Value Date/Time   NA 135  09/14/2023 0929   NA 135 05/15/2023 0956   K 4.2 09/14/2023 0929   CL 102 09/14/2023 0929   CO2 22 09/14/2023 0929   GLUCOSE 102 (H) 09/14/2023 0929   BUN 19 09/14/2023 0929   BUN 13 05/15/2023 0956   CREATININE 0.77 09/14/2023 0929   CREATININE 0.59 12/21/2020 1421   CALCIUM 8.9 09/14/2023 0929   GFRNONAA >60 09/14/2023 0929   GFRAA >60 05/30/2019 0818   Lab Results  Component Value Date   HGBA1C 5.7 (H) 09/14/2023   HGBA1C 5.9 (H) 05/30/2019   Lab Results  Component Value Date   INSULIN 23.7 05/15/2023   Lab Results  Component Value Date   TSH 2.050 05/15/2023   CBC    Component Value Date/Time   WBC 8.3 05/15/2023 0956   WBC 7.7 12/21/2020 1421   RBC 4.71 05/15/2023 0956   RBC 4.31 12/21/2020 1421   HGB 13.7 05/15/2023 0956   HCT 41.0 05/15/2023 0956   PLT 293 05/15/2023 0956   MCV 87 05/15/2023 0956   MCH 29.1 05/15/2023 0956   MCH 29.5 12/21/2020 1421   MCHC 33.4 05/15/2023 0956   MCHC 34.1 12/21/2020 1421   RDW 11.9 05/15/2023 0956   Iron Studies No results found for: "IRON", "TIBC", "FERRITIN", "IRONPCTSAT" Lipid Panel     Component Value Date/Time   CHOL 168 05/15/2023 0956   TRIG 142 05/15/2023 0956   HDL 44 05/15/2023 0956  CHOLHDL 3.4 07/05/2022 1601   CHOLHDL 3.4 12/21/2020 1421   VLDL 14 05/30/2019 0819   LDLCALC 99 05/15/2023 0956   LDLCALC 85 12/21/2020 1421   Hepatic Function Panel     Component Value Date/Time   PROT 7.4 09/14/2023 0929   PROT 6.9 05/15/2023 0956   ALBUMIN 4.2 09/14/2023 0929   ALBUMIN 4.4 05/15/2023 0956   AST 25 09/14/2023 0929   ALT 35 09/14/2023 0929   ALKPHOS 71 09/14/2023 0929   BILITOT 0.7 09/14/2023 0929   BILITOT 0.3 05/15/2023 0956      Component Value Date/Time   TSH 2.050 05/15/2023 0956   Nutritional Lab Results  Component Value Date   VD25OH 107.88 (H) 09/14/2023   VD25OH 29.6 (L) 05/15/2023   VD25OH 27.1 (L) 05/30/2019     Assessment and Plan    Type 2 Diabetes Mellitus Reports  fatigue for the past two weeks, likely due to inadequate protein intake despite weight loss and portion control. Not currently exercising. On Mounjaro 7.5 mg, which is starting to help. Discussed the importance of adequate protein intake to prevent muscle loss and fatigue. Emphasized a balanced approach to diet without guilt. - Refill Mounjaro 7.5 mg - Encourage increased protein intake, especially during Thanksgiving - Use 'don't let it touch' strategy for portion control during large meals - Follow-up appointment on December 12  Obesity Lost three pounds in the last two weeks. Attempting to keep calorie intake under 1300 with at least 85 mg of protein but successful only about 10% of the time. Not currently exercising. Discussed strategies for managing food intake during the holiday season, including the 'don't let it touch' strategy, which has been shown to reduce calorie intake by 40%. - Encourage regular exercise - Continue monitoring weight and dietary intake - Discuss strategies for managing food intake during holiday season  Vitamin D Deficiency Vitamin D level was 29 in July, considered seriously low. Recent supplementation led to a significant increase in vitamin D levels, necessitating a temporary halt in supplementation. Discussed the potential risks of vitamin D toxicity when levels are significantly over 100. - Hold vitamin D supplementation - Recheck vitamin D levels in late January or February  General Health Maintenance Discussed the psychological impact of guilt on hunger and the importance of a balanced approach to diet without guilt. Encouraged getting back on track after dietary lapses. - Encourage a balanced approach to diet without guilt - Reinforce the importance of getting back on track after dietary lapses  Follow-up - Follow-up appointment on December 12 - Follow-up appointment on January 2.         She was informed of the importance of frequent follow up visits  to maximize her success with intensive lifestyle modifications for her multiple health conditions.    Quillian Quince, MD

## 2023-09-22 ENCOUNTER — Other Ambulatory Visit: Payer: Self-pay

## 2023-09-22 ENCOUNTER — Other Ambulatory Visit (HOSPITAL_COMMUNITY): Payer: Self-pay

## 2023-09-25 ENCOUNTER — Other Ambulatory Visit: Payer: Self-pay

## 2023-09-29 ENCOUNTER — Other Ambulatory Visit (HOSPITAL_COMMUNITY): Payer: Self-pay

## 2023-09-29 ENCOUNTER — Other Ambulatory Visit: Payer: Self-pay

## 2023-10-12 ENCOUNTER — Ambulatory Visit (INDEPENDENT_AMBULATORY_CARE_PROVIDER_SITE_OTHER): Payer: Commercial Managed Care - PPO | Admitting: Family Medicine

## 2023-10-12 ENCOUNTER — Other Ambulatory Visit: Payer: Self-pay

## 2023-10-12 ENCOUNTER — Encounter (INDEPENDENT_AMBULATORY_CARE_PROVIDER_SITE_OTHER): Payer: Self-pay | Admitting: Family Medicine

## 2023-10-12 VITALS — BP 115/68 | HR 67 | Temp 97.9°F | Ht 62.5 in | Wt 188.0 lb

## 2023-10-12 DIAGNOSIS — K5909 Other constipation: Secondary | ICD-10-CM

## 2023-10-12 DIAGNOSIS — E1169 Type 2 diabetes mellitus with other specified complication: Secondary | ICD-10-CM

## 2023-10-12 DIAGNOSIS — E669 Obesity, unspecified: Secondary | ICD-10-CM | POA: Diagnosis not present

## 2023-10-12 DIAGNOSIS — E559 Vitamin D deficiency, unspecified: Secondary | ICD-10-CM | POA: Diagnosis not present

## 2023-10-12 DIAGNOSIS — Z6833 Body mass index (BMI) 33.0-33.9, adult: Secondary | ICD-10-CM

## 2023-10-12 DIAGNOSIS — Z7985 Long-term (current) use of injectable non-insulin antidiabetic drugs: Secondary | ICD-10-CM

## 2023-10-12 MED ORDER — TIRZEPATIDE 7.5 MG/0.5ML ~~LOC~~ SOAJ
7.5000 mg | SUBCUTANEOUS | 0 refills | Status: DC
  Filled 2023-10-12: qty 6, 84d supply, fill #0
  Filled 2023-10-27: qty 2, 28d supply, fill #0
  Filled 2023-10-30 – 2023-11-24 (×2): qty 2, 28d supply, fill #1

## 2023-10-12 NOTE — Progress Notes (Signed)
.smr  Office: [phone]  /  Fax: [phone]  WEIGHT SUMMARY AND BIOMETRICS  Anthropometric Measurements Height: 5' 2.5" (1.588 m) Weight: 188 lb (85.3 kg) BMI (Calculated): 33.82 Weight at Last Visit: 186 lb Weight Lost Since Last Visit: 0 Weight Gained Since Last Visit: 2 lb Starting Weight: 201 lb Total Weight Loss (lbs): 13 lb (5.897 kg) Peak Weight: 220 lb   Body Composition  Body Fat %: 42 % Fat Mass (lbs): 79.2 lbs Muscle Mass (lbs): 103.6 lbs Total Body Water (lbs): 75.2 lbs Visceral Fat Rating : 11   Other Clinical Data Fasting: No Labs: No Today's Visit #: 9 Starting Date: 05/15/23    Chief Complaint: OBESITY   History of Present Illness   The patient, with a history of type 2 diabetes, vitamin D deficiency, and obesity, has been managing her conditions with Mounjaro 7.5 mg weekly, diet, and exercise. She reports an improvement in her hemoglobin A1c from 7.3 to 5.7, indicating well-controlled diabetes. However, her vitamin D levels have increased from 29 to 107, leading to over-replacement. As a result, she was advised to stop her vitamin D prescription.  Despite her efforts in diet and exercise, the patient has gained two pounds in the last month. She has been journaling her food intake occasionally and aims for a calorie goal of 1300 calories per day and a protein goal of 85 or more grams per day. She estimates she meets these goals about 20% of the time. To improve balance and strength, she has been using a balance board for about 15 minutes four times per week.  The patient reports constipation, which she attributes to her [facility] medication. She has been managing this with occasional Miralax in her water, but she has not been taking it daily.  The patient has been working on improving her diet, incorporating more cottage cheese into her meals despite not liking it. She has been using it as a substitute for cream cheese in a baked dish with broccoli,  hamburger, and sausage, which she reports enjoying. She has been trying to avoid bread and fill up more on protein and vegetables, but she admits to indulging in desserts occasionally.  The patient's work stress has not been significant, and she has been using a vibration balance board for exercise. She plans to continue her efforts in diet and exercise into the new year.          PHYSICAL EXAM:  Blood pressure 115/68, pulse 67, temperature 97.9 F (36.6 C), height 5' 2.5" (1.588 m), weight 188 lb (85.3 kg), SpO2 100%. Body mass index is 33.84 kg/m.  DIAGNOSTIC DATA REVIEWED:  BMET    Component Value Date/Time   NA 135 09/14/2023 0929   NA 135 05/15/2023 0956   K 4.2 09/14/2023 0929   CL 102 09/14/2023 0929   CO2 22 09/14/2023 0929   GLUCOSE 102 (H) 09/14/2023 0929   BUN 19 09/14/2023 0929   BUN 13 05/15/2023 0956   CREATININE 0.77 09/14/2023 0929   CREATININE 0.59 12/21/2020 1421   CALCIUM 8.9 09/14/2023 0929   GFRNONAA >60 09/14/2023 0929   GFRAA >60 05/30/2019 0818   Lab Results  Component Value Date   HGBA1C 5.7 (H) 09/14/2023   HGBA1C 5.9 (H) 05/30/2019   Lab Results  Component Value Date   INSULIN 23.7 05/15/2023   Lab Results  Component Value Date   TSH 2.050 05/15/2023   CBC    Component Value Date/Time   WBC 8.3 05/15/2023 0956  WBC 7.7 12/21/2020 1421   RBC 4.71 05/15/2023 0956   RBC 4.31 12/21/2020 1421   HGB 13.7 05/15/2023 0956   HCT 41.0 05/15/2023 0956   PLT 293 05/15/2023 0956   MCV 87 05/15/2023 0956   MCH 29.1 05/15/2023 0956   MCH 29.5 12/21/2020 1421   MCHC 33.4 05/15/2023 0956   MCHC 34.1 12/21/2020 1421   RDW 11.9 05/15/2023 0956   Iron Studies No results found for: "IRON", "TIBC", "FERRITIN", "IRONPCTSAT" Lipid Panel     Component Value Date/Time   CHOL 168 05/15/2023 0956   TRIG 142 05/15/2023 0956   HDL 44 05/15/2023 0956   CHOLHDL 3.4 07/05/2022 1601   CHOLHDL 3.4 12/21/2020 1421   VLDL 14 05/30/2019 0819   LDLCALC  99 05/15/2023 0956   LDLCALC 85 12/21/2020 1421   Hepatic Function Panel     Component Value Date/Time   PROT 7.4 09/14/2023 0929   PROT 6.9 05/15/2023 0956   ALBUMIN 4.2 09/14/2023 0929   ALBUMIN 4.4 05/15/2023 0956   AST 25 09/14/2023 0929   ALT 35 09/14/2023 0929   ALKPHOS 71 09/14/2023 0929   BILITOT 0.7 09/14/2023 0929   BILITOT 0.3 05/15/2023 0956      Component Value Date/Time   TSH 2.050 05/15/2023 0956   Nutritional Lab Results  Component Value Date   VD25OH 107.88 (H) 09/14/2023   VD25OH 29.6 (L) 05/15/2023   VD25OH 27.1 (L) 05/30/2019     Assessment and Plan    Type 2 Diabetes Mellitus Type 2 diabetes is well controlled with Mounjaro 7.5 mg weekly. Hemoglobin A1c has improved from 7.3 to 5.7. Mild constipation likely due to [facility], explained as due to slowed GI tract and dehydration. Regular use of Miralax can help maintain softer bowel movements. - Continue Mounjaro 7.5 mg weekly - Advise regular use of Miralax to manage constipation  Obesity Weight gain of 2 pounds in the last month. Current regimen includes a calorie goal of 1300 calories/day, protein goal of 85 grams/day, and balance board exercises for 15 minutes, four times per week. Discussed benefits of wobble board for core strengthening and importance of portion control and journaling. - Continue current diet and exercise regimen - Encourage journaling and portion control - Consider using a wobble board for better core strengthening - Schedule follow-up appointment in February  Vitamin D Deficiency Vitamin D levels increased from 29 to 107, indicating over-replacement. Advised to stop vitamin D supplementation. Explained rapid increase and likely quick fall of levels. Plan to recheck levels at the end of January and consider switching to over-the-counter vitamin D to maintain levels around 50. - Recheck vitamin D levels at the end of January - Consider switching to over-the-counter vitamin D to  maintain levels around 50  General Health Maintenance Managing stress well and making dietary adjustments to include more protein. Discussed healthy eating habits during the holiday season. - Encourage continued stress management techniques - Advise on healthy eating habits during the holiday season  Follow-up - Confirm January 2nd appointment - Schedule follow-up appointment for February.        She was informed of the importance of frequent follow up visits to maximize her success with intensive lifestyle modifications for her multiple health conditions.    Quillian Quince, MD

## 2023-10-13 ENCOUNTER — Other Ambulatory Visit: Payer: Self-pay

## 2023-10-27 ENCOUNTER — Other Ambulatory Visit: Payer: Self-pay

## 2023-10-27 ENCOUNTER — Other Ambulatory Visit (HOSPITAL_BASED_OUTPATIENT_CLINIC_OR_DEPARTMENT_OTHER): Payer: Self-pay

## 2023-10-30 ENCOUNTER — Other Ambulatory Visit: Payer: Self-pay

## 2023-11-02 ENCOUNTER — Ambulatory Visit (INDEPENDENT_AMBULATORY_CARE_PROVIDER_SITE_OTHER): Payer: Commercial Managed Care - PPO | Admitting: Family Medicine

## 2023-11-14 ENCOUNTER — Ambulatory Visit: Payer: Commercial Managed Care - PPO | Admitting: Adult Health

## 2023-11-14 ENCOUNTER — Encounter: Payer: Self-pay | Admitting: Adult Health

## 2023-11-14 VITALS — BP 116/75 | HR 80 | Ht 63.0 in | Wt 193.5 lb

## 2023-11-14 DIAGNOSIS — Z30432 Encounter for removal of intrauterine contraceptive device: Secondary | ICD-10-CM

## 2023-11-14 NOTE — Progress Notes (Signed)
  Subjective:     Patient ID: Miranda Williams, female   DOB: 05/11/1971, 53 y.o.   MRN: [id number]  HPI Miranda Williams is a 53 year old white female, divorced, G1P1 in for IUD removal.     Component Value Date/Time   DIAGPAP  07/05/2022 1524    - Negative for intraepithelial lesion or malignancy (NILM)   DIAGPAP  06/02/2021 1532    - Negative for intraepithelial lesion or malignancy (NILM)   DIAGPAP  06/01/2020 1541    - Negative for intraepithelial lesion or malignancy (NILM)   HPVHIGH Negative 07/05/2022 1524   HPVHIGH Negative 06/02/2021 1532   HPVHIGH Negative 06/01/2020 1541   ADEQPAP  07/05/2022 1524    Satisfactory for evaluation; transformation zone component PRESENT.   ADEQPAP  06/02/2021 1532    Satisfactory for evaluation; transformation zone component PRESENT.   ADEQPAP  06/01/2020 1541    Satisfactory for evaluation; transformation zone component PRESENT.    PCP is Miranda Buttner PA  Review of Systems For IUD removal  No period in years Not having sex Reviewed past medical,surgical, social and family history. Reviewed medications and allergies.     Objective:   Physical Exam BP 116/75 (BP Location: Left Arm, Patient Position: Sitting, Cuff Size: Normal)   Pulse 80   Ht 5' 3 (1.6 m)   Wt 193 lb 8 oz (87.8 kg)   BMI 34.28 kg/m  Consent signed for IUD removal.   Skin warm and dry.Pelvic: external genitalia is normal in appearance no lesions, vagina: pink, urethra has no lesions or masses noted, cervix:smooth, +IUD strings at os, grasped strings with forceps, and asked pt to cough and IUD easily removed, uterus: normal size, shape and contour, non tender, no masses felt, adnexa: no masses or tenderness noted. Bladder is non tender and no masses felt.  Fall risk is low  Upstream - 11/14/23 1514       Pregnancy Intention Screening   Does the patient want to become pregnant in the next year? No    Does the patient's partner want to become pregnant in the next year? No    Would  the patient like to discuss contraceptive options today? No      Contraception Wrap Up   Current Method Abstinence;IUD or IUS    End Method Abstinence            Examination chaperoned by Clarita Salt LPN  Assessment:     1. Encounter for IUD removal (Primary) IUD removed     Plan:     Return 12/13/23 for physical at 3:30 pm

## 2023-11-24 ENCOUNTER — Other Ambulatory Visit (HOSPITAL_COMMUNITY): Payer: Self-pay

## 2023-11-29 IMAGING — DX DG KNEE 1-2V*R*
2 series · 2 of 2 positions shown · non-contrast
Comparison: Earlier same day

CLINICAL DATA: Post patellar reduction.

EXAM:
RIGHT KNEE - 1-2 VIEW

[knee ap]
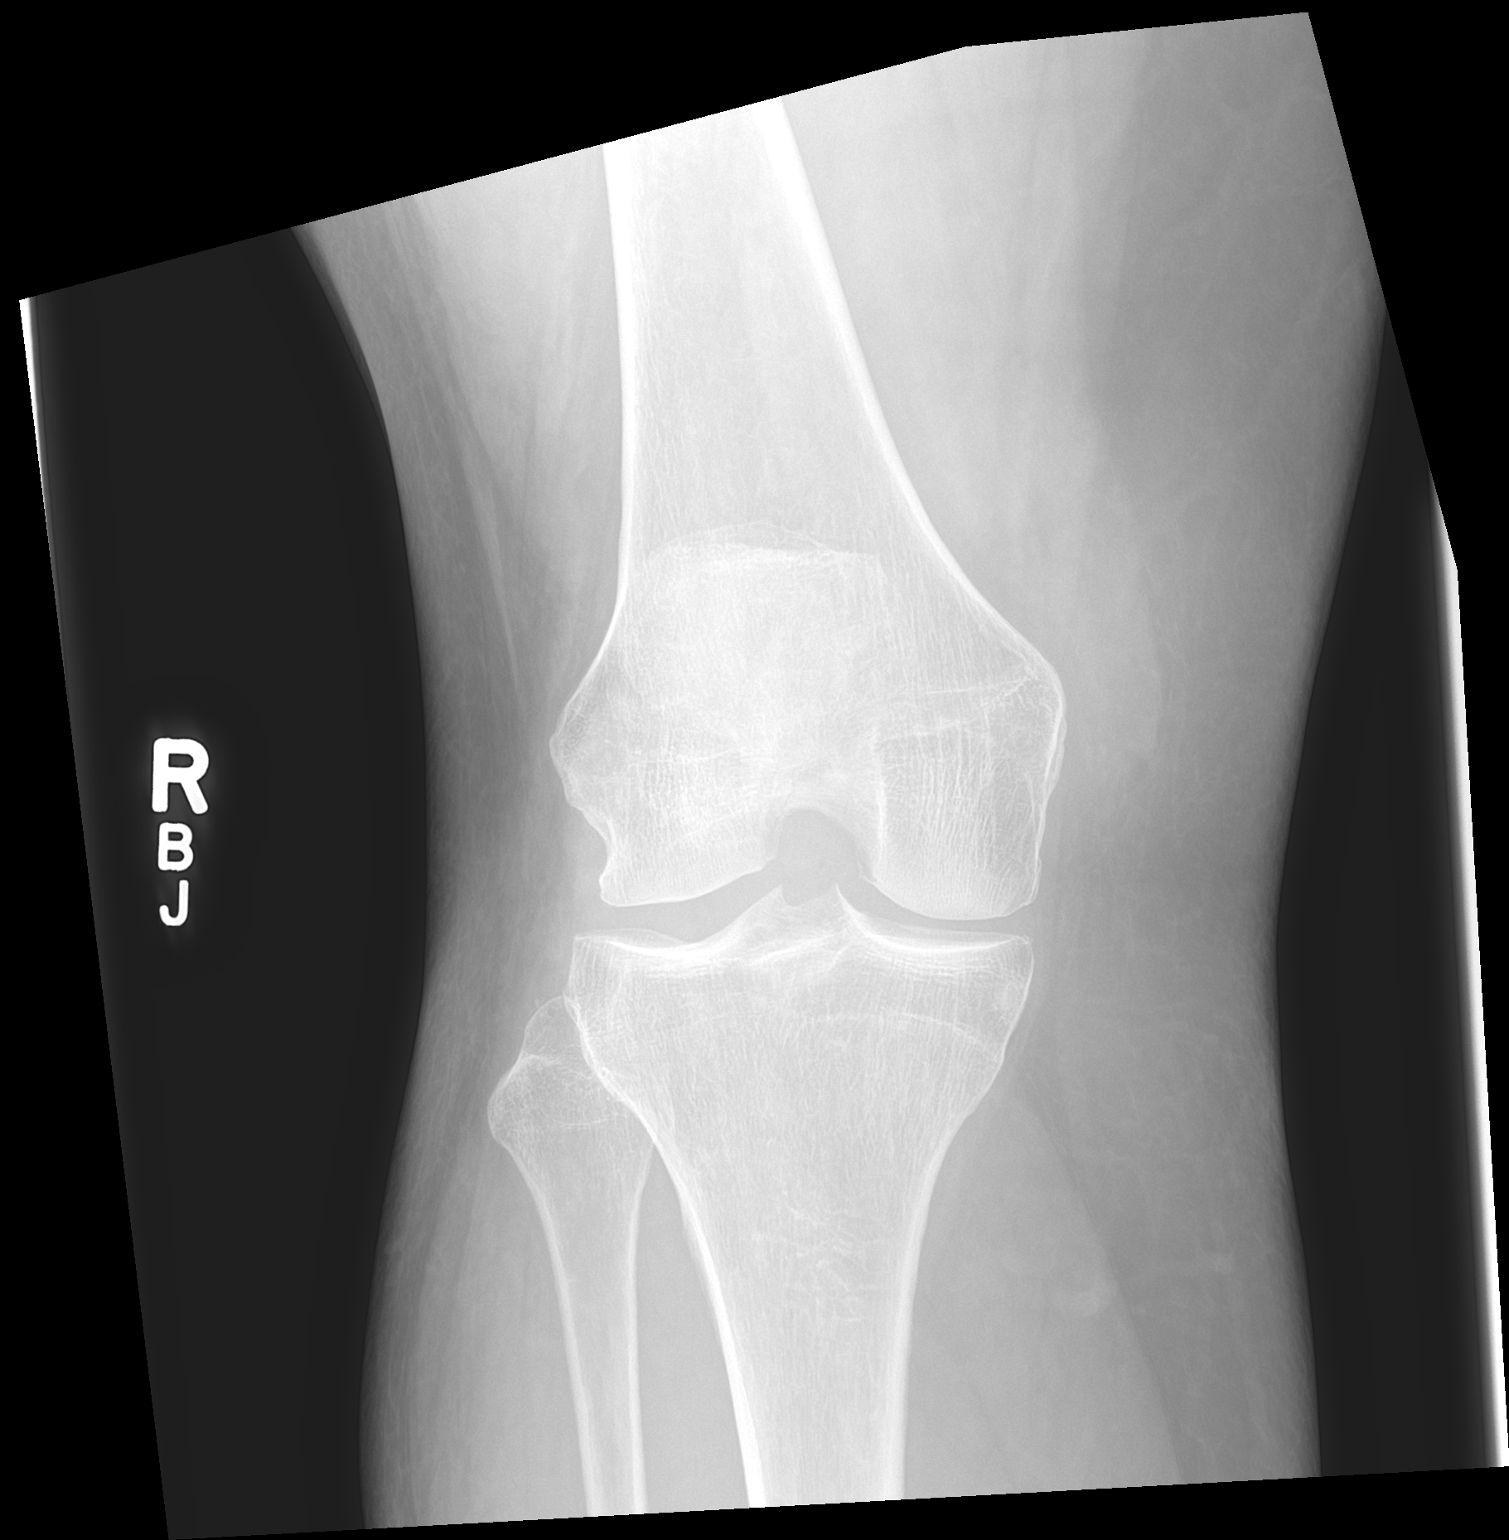

[knee lat]
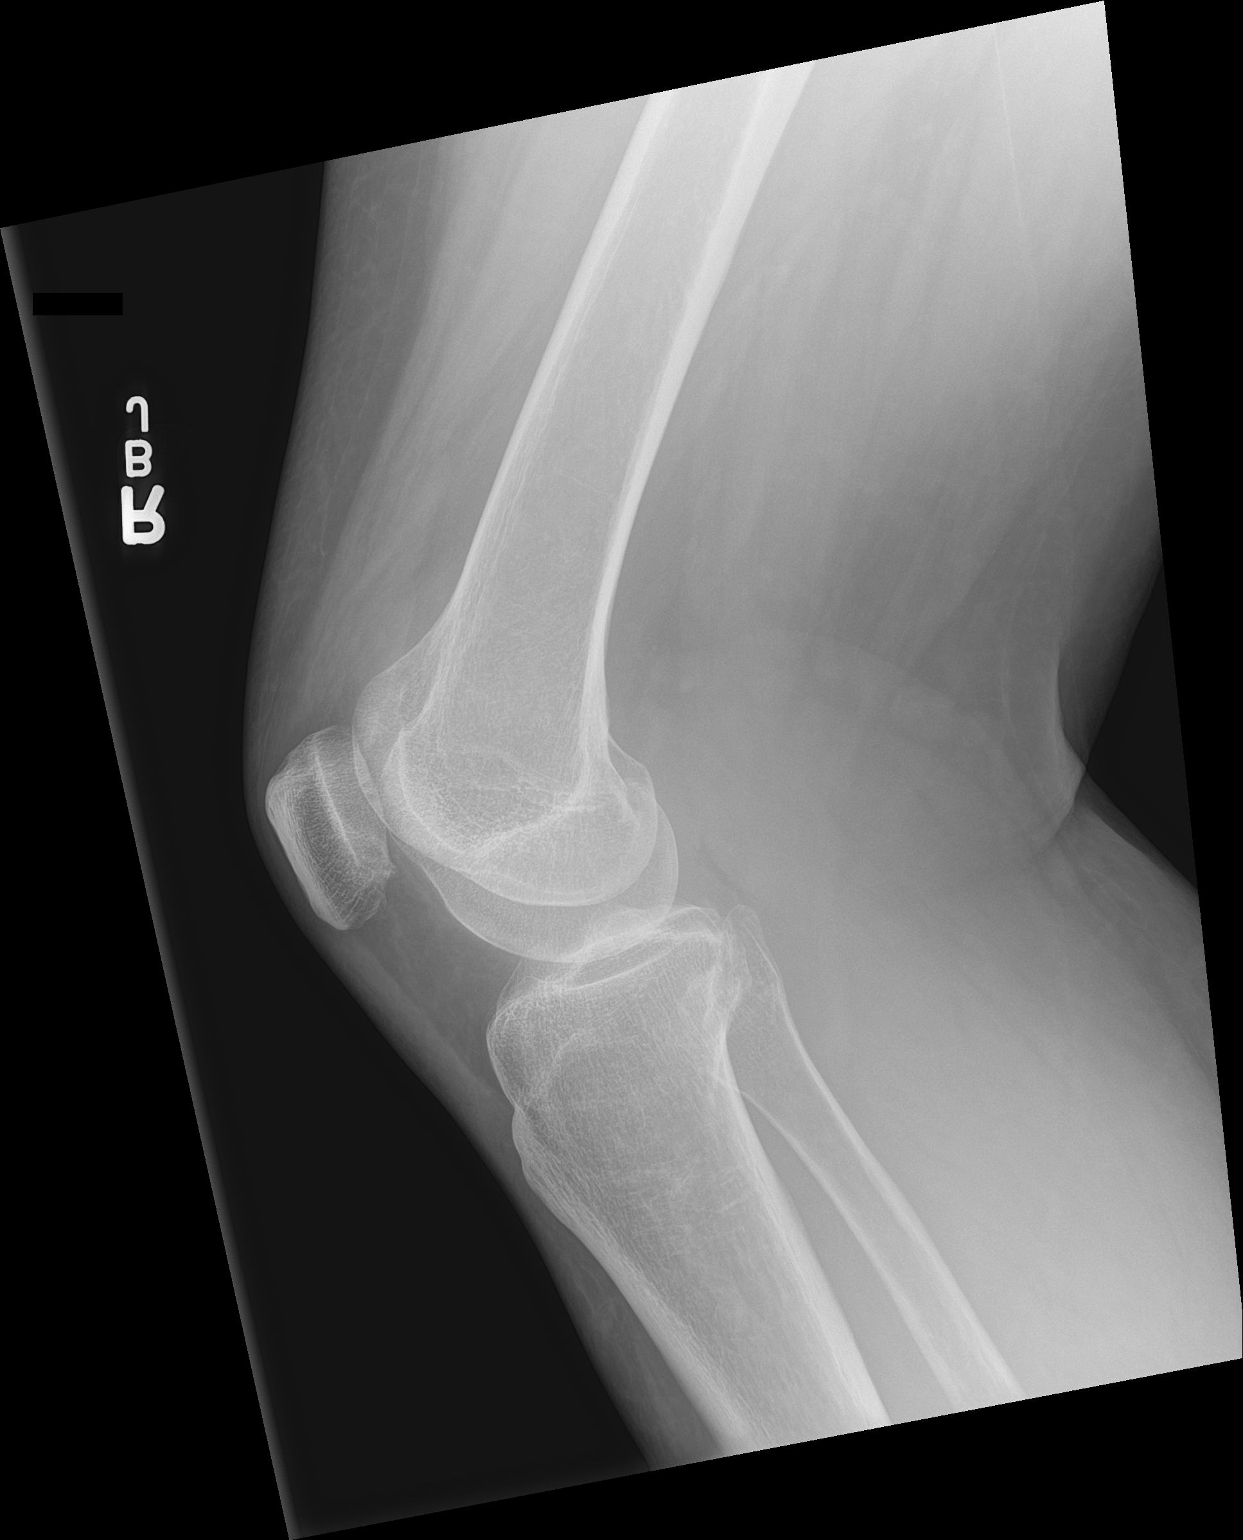

[2 of 2 positions shown; findings below may reference images not displayed]

FINDINGS: Reduction of previously seen lateral patellar dislocation. No
visible fracture. Small joint effusion.
IMPRESSION: Reduction of previously seen lateral patellar dislocation. Small
joint effusion.

## 2023-11-29 IMAGING — DX DG KNEE 1-2V*R*
2 series · 2 of 2 positions shown · non-contrast
Comparison: None Available.

CLINICAL DATA: Deformity right knee. Knee is stuck in flexed
position with inability to extend.

EXAM:
RIGHT KNEE - 1-2 VIEW

[knee ap]
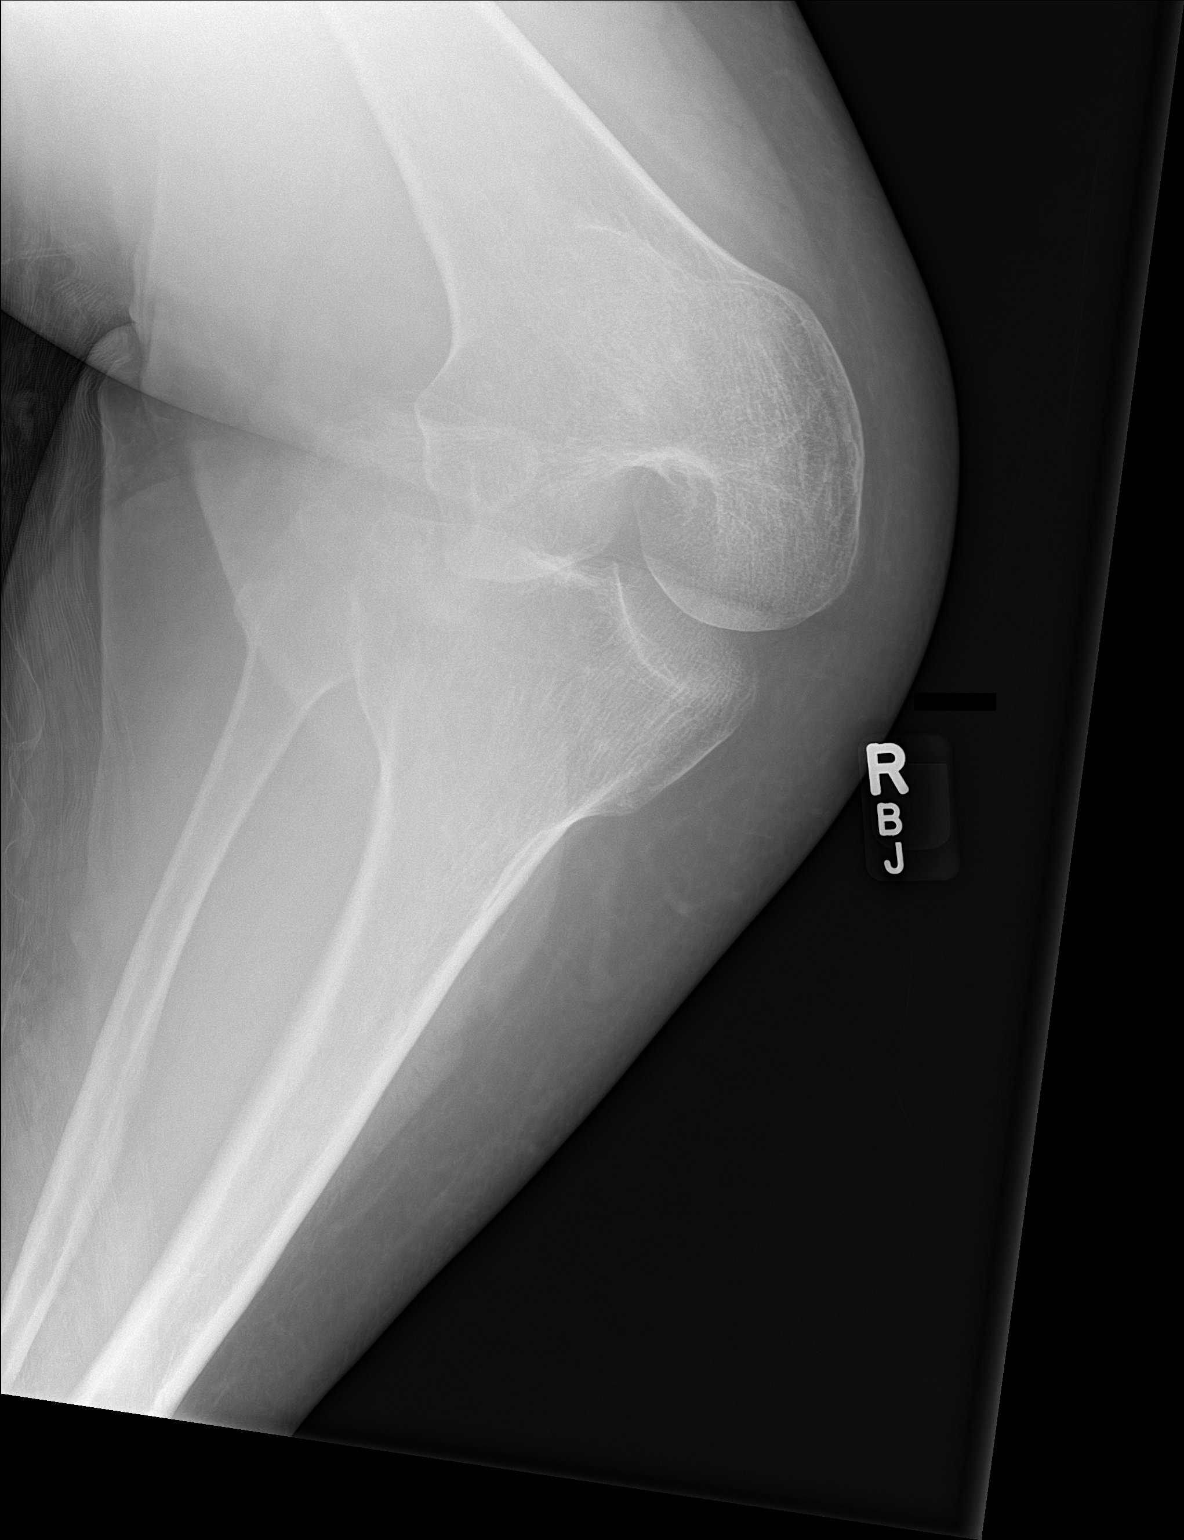

[knee lat]
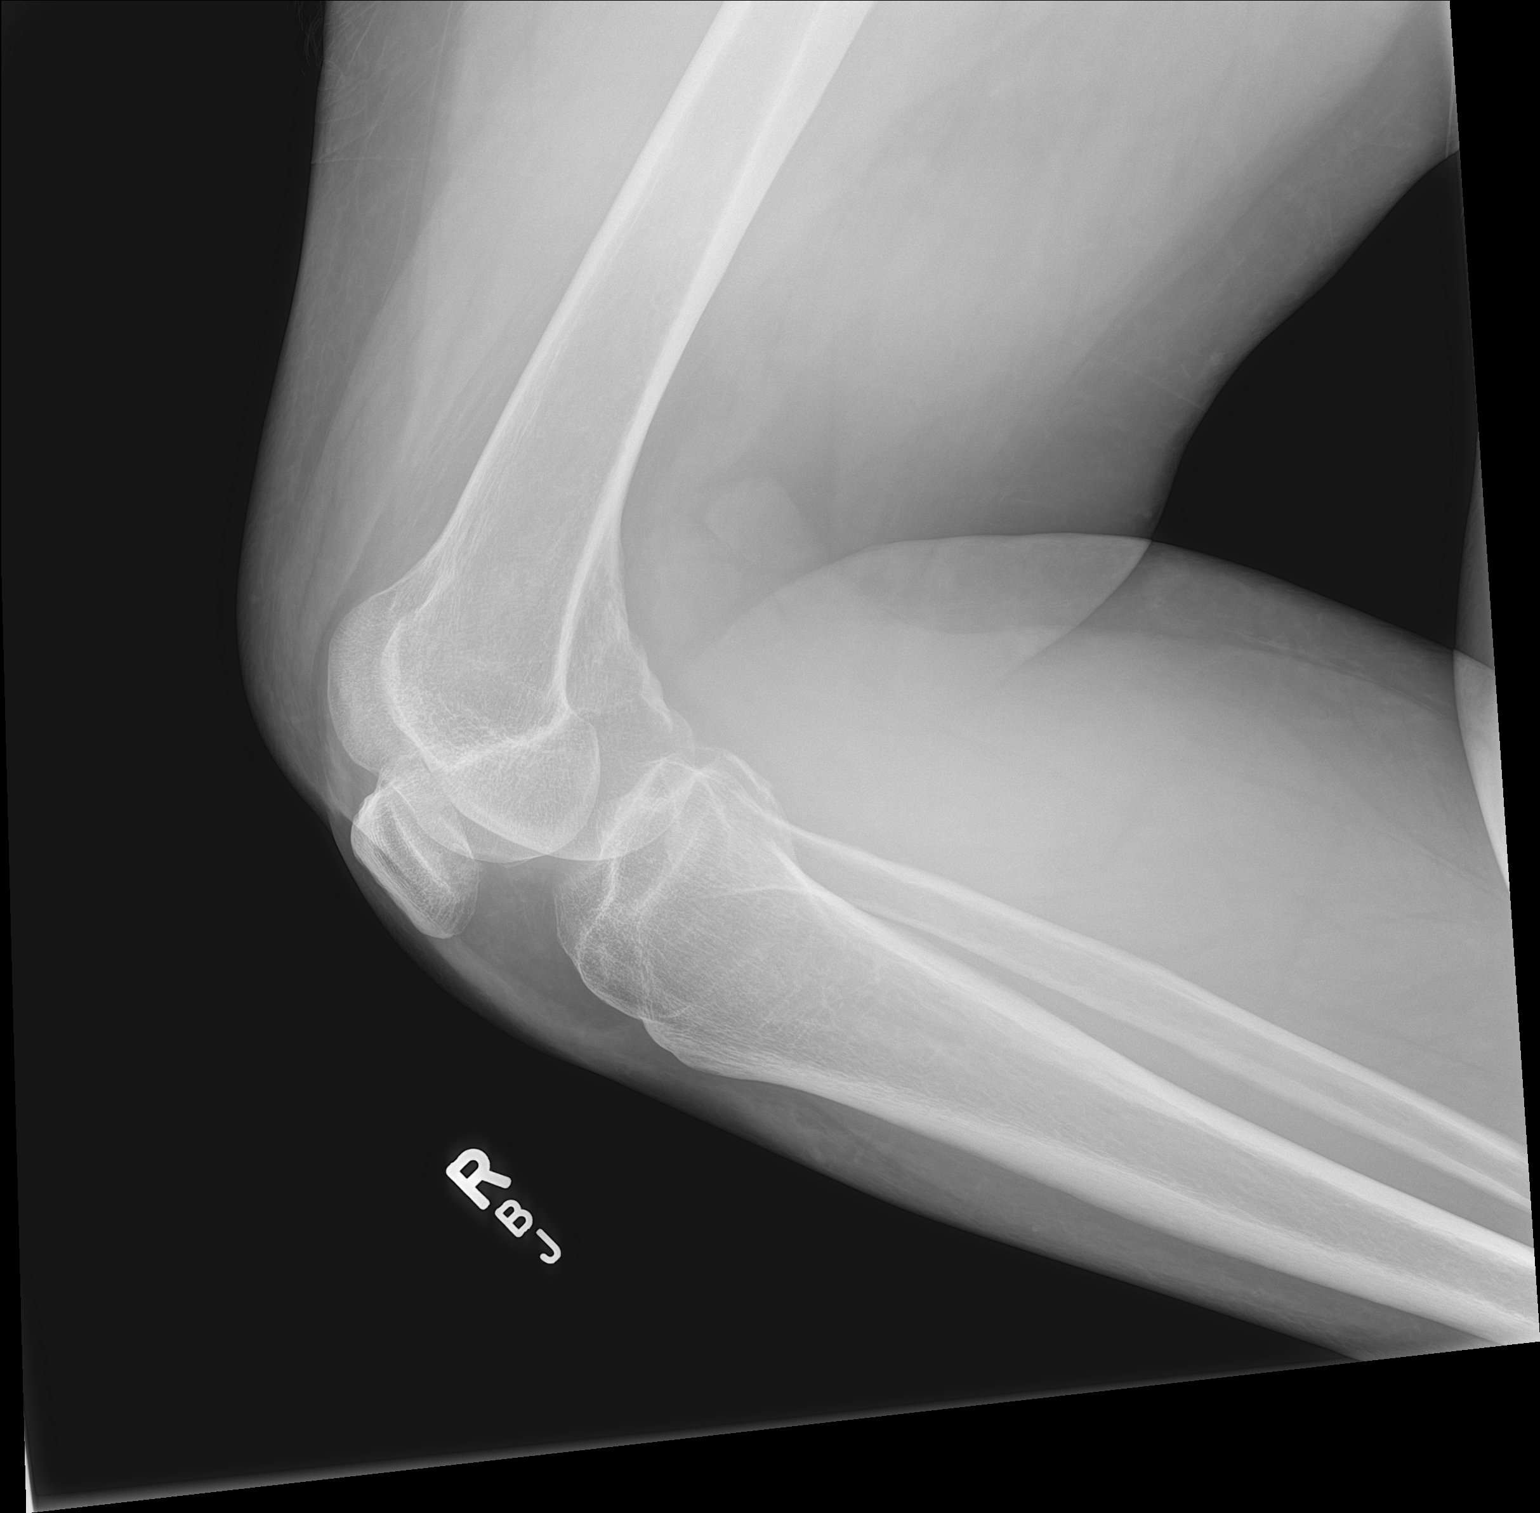

[2 of 2 positions shown; findings below may reference images not displayed]

FINDINGS: Suboptimal positioning as patient unable to extend knee.

No significant joint effusion. No definite evidence of fracture.
Patella appears laterally subluxed on the frontal image. Remainder
of the exam is unremarkable.
IMPRESSION: Patella appears laterally subluxed on the frontal image, although
positioning is suboptimal due to patient's inability to extend knee.
No acute fracture visualized.

## 2023-12-12 ENCOUNTER — Ambulatory Visit (INDEPENDENT_AMBULATORY_CARE_PROVIDER_SITE_OTHER): Payer: Commercial Managed Care - PPO | Admitting: Family Medicine

## 2023-12-12 ENCOUNTER — Other Ambulatory Visit (HOSPITAL_COMMUNITY): Payer: Self-pay

## 2023-12-12 ENCOUNTER — Encounter (INDEPENDENT_AMBULATORY_CARE_PROVIDER_SITE_OTHER): Payer: Self-pay | Admitting: Family Medicine

## 2023-12-12 VITALS — BP 108/62 | HR 78 | Temp 97.8°F | Ht 62.5 in | Wt 188.0 lb

## 2023-12-12 DIAGNOSIS — E1169 Type 2 diabetes mellitus with other specified complication: Secondary | ICD-10-CM | POA: Diagnosis not present

## 2023-12-12 DIAGNOSIS — F5089 Other specified eating disorder: Secondary | ICD-10-CM | POA: Diagnosis not present

## 2023-12-12 DIAGNOSIS — Z6833 Body mass index (BMI) 33.0-33.9, adult: Secondary | ICD-10-CM

## 2023-12-12 DIAGNOSIS — E669 Obesity, unspecified: Secondary | ICD-10-CM | POA: Diagnosis not present

## 2023-12-12 DIAGNOSIS — E785 Hyperlipidemia, unspecified: Secondary | ICD-10-CM | POA: Diagnosis not present

## 2023-12-12 DIAGNOSIS — Z7985 Long-term (current) use of injectable non-insulin antidiabetic drugs: Secondary | ICD-10-CM

## 2023-12-12 DIAGNOSIS — F3289 Other specified depressive episodes: Secondary | ICD-10-CM

## 2023-12-12 DIAGNOSIS — E7849 Other hyperlipidemia: Secondary | ICD-10-CM

## 2023-12-12 DIAGNOSIS — Z6834 Body mass index (BMI) 34.0-34.9, adult: Secondary | ICD-10-CM

## 2023-12-12 MED ORDER — TIRZEPATIDE 7.5 MG/0.5ML ~~LOC~~ SOAJ
7.5000 mg | SUBCUTANEOUS | 0 refills | Status: DC
  Filled 2023-12-12 – 2023-12-18 (×2): qty 6, 84d supply, fill #0

## 2023-12-12 NOTE — Progress Notes (Signed)
.smr  Office: [phone]  /  Fax: [phone]  WEIGHT SUMMARY AND BIOMETRICS  Anthropometric Measurements Height: 5' 2.5" (1.588 m) Weight: 188 lb (85.3 kg) BMI (Calculated): 33.82 Weight at Last Visit: 188 lb Weight Lost Since Last Visit: 0 Weight Gained Since Last Visit: 0 Starting Weight: 201 lb Total Weight Loss (lbs): 13 lb (5.897 kg)   Body Composition  Body Fat %: 42.3 % Fat Mass (lbs): 79.8 lbs Muscle Mass (lbs): 103.4 lbs Total Body Water (lbs): 75.8 lbs Visceral Fat Rating : 11   Other Clinical Data Fasting: No Labs: No Today's Visit #: 10 Starting Date: 05/15/23    Chief Complaint: OBESITY    History of Present Illness   Miranda Williams is a 53 year old female with obesity and type 2 diabetes who presents with weight management concerns.  She has been working on diet and exercise to manage her weight and type 2 diabetes. Despite her efforts, she has maintained her weight over the last two months. She has not been following a structured eating plan, journaling, or exercising regularly. Her previous calorie goal was 1300 calories per day with a protein intake of 85 grams or more per day.  During the holidays, she indulged in various foods but was mindful of her choices. She acknowledges that she did not adhere to her dietary goals. She notes that her legs and hips appear to be getting smaller, but her abdominal area remains unchanged.  She is currently taking Pristiq for hyperlipidemia and has been attempting to increase her cholesterol intake as part of her weight loss strategy. She also mentions consuming bananas with peanut butter, recognizing it as a high-sugar combination that she plans to avoid.  She is on [facility] and is considering how it might affect her ability to adhere to a structured dietary plan. She reflects on the importance of finding happiness in activities other than eating, such as crafting and building adult Lego sets, which she has  started doing to avoid snacking while watching TV.          PHYSICAL EXAM:  Blood pressure 108/62, pulse 78, temperature 97.8 F (36.6 C), height 5' 2.5" (1.588 m), weight 188 lb (85.3 kg), SpO2 96%. Body mass index is 33.84 kg/m.  DIAGNOSTIC DATA REVIEWED:  BMET    Component Value Date/Time   NA 135 09/14/2023 0929   NA 135 05/15/2023 0956   K 4.2 09/14/2023 0929   CL 102 09/14/2023 0929   CO2 22 09/14/2023 0929   GLUCOSE 102 (H) 09/14/2023 0929   BUN 19 09/14/2023 0929   BUN 13 05/15/2023 0956   CREATININE 0.77 09/14/2023 0929   CREATININE 0.59 12/21/2020 1421   CALCIUM 8.9 09/14/2023 0929   GFRNONAA >60 09/14/2023 0929   GFRAA >60 05/30/2019 0818   Lab Results  Component Value Date   HGBA1C 5.7 (H) 09/14/2023   HGBA1C 5.9 (H) 05/30/2019   Lab Results  Component Value Date   INSULIN 23.7 05/15/2023   Lab Results  Component Value Date   TSH 2.050 05/15/2023   CBC    Component Value Date/Time   WBC 8.3 05/15/2023 0956   WBC 7.7 12/21/2020 1421   RBC 4.71 05/15/2023 0956   RBC 4.31 12/21/2020 1421   HGB 13.7 05/15/2023 0956   HCT 41.0 05/15/2023 0956   PLT 293 05/15/2023 0956   MCV 87 05/15/2023 0956   MCH 29.1 05/15/2023 0956   MCH 29.5 12/21/2020 1421   MCHC 33.4 05/15/2023 0956  MCHC 34.1 12/21/2020 1421   RDW 11.9 05/15/2023 0956   Iron Studies No results found for: "IRON", "TIBC", "FERRITIN", "IRONPCTSAT" Lipid Panel     Component Value Date/Time   CHOL 168 05/15/2023 0956   TRIG 142 05/15/2023 0956   HDL 44 05/15/2023 0956   CHOLHDL 3.4 07/05/2022 1601   CHOLHDL 3.4 12/21/2020 1421   VLDL 14 05/30/2019 0819   LDLCALC 99 05/15/2023 0956   LDLCALC 85 12/21/2020 1421   Hepatic Function Panel     Component Value Date/Time   PROT 7.4 09/14/2023 0929   PROT 6.9 05/15/2023 0956   ALBUMIN 4.2 09/14/2023 0929   ALBUMIN 4.4 05/15/2023 0956   AST 25 09/14/2023 0929   ALT 35 09/14/2023 0929   ALKPHOS 71 09/14/2023 0929   BILITOT 0.7  09/14/2023 0929   BILITOT 0.3 05/15/2023 0956      Component Value Date/Time   TSH 2.050 05/15/2023 0956   Nutritional Lab Results  Component Value Date   VD25OH 107.88 (H) 09/14/2023   VD25OH 29.6 (L) 05/15/2023   VD25OH 27.1 (L) 05/30/2019     Assessment and Plan    Obesity Maintained weight over the last two months without structured eating, journaling, or exercising. Expressed desire to resume weight loss efforts. Discussed reducing simple carbohydrates to lower insulin levels and visceral fat, particularly around the waist. Explained insulin's role in fat storage, especially around organs. Discussed structured eating plan (Category 2) to reduce insulin levels and improve weight loss outcomes. Addressed potential difficulty of eating everything on the plan with [facility] and possible dose adjustment based on response. - Reinstate structured eating plan (Category 2) with specific food recommendations - Avoid high-sugar foods like bananas with peanut butter - Encourage portion control and smart choices  - Schedule follow-up appointment in March and April  Type 2 Diabetes Mellitus Diabetes management closely tied to weight and dietary habits. Emphasized insulin's role in fat storage and the need to control carbohydrate intake to manage insulin levels and reduce visceral fat. Discussed that reducing simple carbohydrates will help lower insulin levels and improve diabetes management. - Follow structured eating plan to manage carbohydrate intake - Monitor blood glucose levels - Continue current diabetes medications - Schedule follow-up appointment in March and April - Continue [facility] and monitor its effects on appetite and weight loss  Hyperlipidemia On Pristiq for hyperlipidemia and has been trying to increase her cholesterol intake and weight loss to improve her levels. - Continue Pristiq - Monitor lipid levels at next follow-up  Emotional Eating Behaviors Discussed  importance of finding non-food-related activities to increase happiness and reduce reliance on food for emotional satisfaction. Mentioned enjoying crafting and adult Legos as alternative activities. - Encourage engaging in hobbies like crafting and adult Legos to reduce emotional eating - Monitor and adjust lifestyle changes at next follow-up  Follow-up - Schedule follow-up appointments in March and April - Monitor weight, blood glucose levels, and lipid levels at follow-up.        She was informed of the importance of frequent follow up visits to maximize her success with intensive lifestyle modifications for her multiple health conditions.    Quillian Quince, MD

## 2023-12-13 ENCOUNTER — Encounter: Payer: Self-pay | Admitting: Adult Health

## 2023-12-13 ENCOUNTER — Other Ambulatory Visit (HOSPITAL_COMMUNITY): Payer: Self-pay

## 2023-12-13 ENCOUNTER — Ambulatory Visit (INDEPENDENT_AMBULATORY_CARE_PROVIDER_SITE_OTHER): Payer: Commercial Managed Care - PPO | Admitting: Adult Health

## 2023-12-13 ENCOUNTER — Other Ambulatory Visit (HOSPITAL_COMMUNITY)
Admission: RE | Admit: 2023-12-13 | Discharge: 2023-12-13 | Disposition: A | Discharge status: Home / Self Care | Payer: Commercial Managed Care - PPO | Source: Ambulatory Visit | Attending: Adult Health | Admitting: Adult Health

## 2023-12-13 VITALS — BP 101/64 | HR 69 | Ht 62.0 in | Wt 193.0 lb

## 2023-12-13 DIAGNOSIS — Z01419 Encounter for gynecological examination (general) (routine) without abnormal findings: Secondary | ICD-10-CM

## 2023-12-13 DIAGNOSIS — Z1331 Encounter for screening for depression: Secondary | ICD-10-CM | POA: Diagnosis not present

## 2023-12-13 NOTE — Progress Notes (Signed)
Patient ID: Miranda Williams, female   DOB: 05/14/1971, 53 y.o.   MRN: [id number] History of Present Illness: Miranda Williams is a 53 year old white female,divorced, G1P1 in for a well woman gyn exam and pap.  PCP is Miranda Bowens PA.    Current Medications, Allergies, Past Medical History, Past Surgical History, Family History and Social History were reviewed in [employer] record.     Review of Systems: Patient denies any headaches, hearing loss, fatigue, blurred vision, shortness of breath, chest pain, abdominal pain, problems with bowel movements, urination, or intercourse.(Not active). No joint pain or mood swings.  No bleeding since IUD removed, actually feels better   Physical Exam:BP 101/64 (BP Location: Left Arm, Patient Position: Sitting, Cuff Size: Normal)   Pulse 69   Ht 5\' 2"  (1.575 m)   Wt 193 lb (87.5 kg)   BMI 35.30 kg/m   General:  Well developed, well nourished, no acute distress Skin:  Warm and dry Neck:  Midline trachea, normal thyroid, good ROM, no lymphadenopathy Lungs; Clear to auscultation bilaterally Breast:  No dominant palpable mass, retraction, or nipple discharge Cardiovascular: Regular rate and rhythm Abdomen:  Soft, non tender, no hepatosplenomegaly Pelvic:  External genitalia is normal in appearance, no lesions.  The vagina is normal in appearance. Urethra has no lesions or masses. The cervix is smooth, pap with HR HPV genotyping performed.  Uterus is felt to be normal size, shape, and contour.  No adnexal masses or tenderness noted.Bladder is non tender, no masses felt. Rectal: Deferred Extremities/musculoskeletal:  No swelling or varicosities noted, no clubbing or cyanosis Psych:  No mood changes, alert and cooperative,seems happy AA is 1 Fall risk is low    12/13/2023    3:49 PM 07/05/2022    3:20 PM 06/02/2021    3:28 PM  Depression screen PHQ 2/9  Decreased Interest 0 0 0  Down, Depressed, Hopeless 0 0 1  PHQ - 2 Score 0 0 1  Altered  sleeping 0 1 1  Tired, decreased energy 1 1 1   Change in appetite 3 3 3   Feeling bad or failure about yourself  0 0 1  Trouble concentrating 0 0 1  Moving slowly or fidgety/restless 0 0 0  Suicidal thoughts 0 0 0  PHQ-9 Score 4 5 8        12/13/2023    3:49 PM 07/05/2022    3:21 PM 06/02/2021    3:28 PM 06/01/2020    3:43 PM  GAD 7 : Generalized Anxiety Score  Nervous, Anxious, on Edge 0 0 1 0  Control/stop worrying 0 0 0 0  Worry too much - different things 0 0 0 0  Trouble relaxing 0 0 0 0  Restless 0 0 0 0  Easily annoyed or irritable 0 0 0 0  Afraid - awful might happen 0 0 0 0  Total GAD 7 Score 0 0 1 0  Anxiety Difficulty    Not difficult at all    Upstream - 12/13/23 1553       Pregnancy Intention Screening   Does the patient want to become pregnant in the next year? No    Does the patient's partner want to become pregnant in the next year? No    Would the patient like to discuss contraceptive options today? No      Contraception Wrap Up   Current Method Abstinence    End Method Abstinence    Contraception Counseling Provided No  Examination chaperoned by Malachy Mood LPN   Impression and plan: 1. Encounter for gynecological examination with Papanicolaou smear of cervix (Primary) Pap sent Pap in 3-5 years if normal Physical in 1 year Labs at [facility] Mammogram was negative 07/24/23 Colonoscopy per GI - Cytology - PAP( [facility])

## 2023-12-15 LAB — CYTOLOGY - PAP
Comment: NEGATIVE
Diagnosis: NEGATIVE
High risk HPV: NEGATIVE

## 2023-12-18 ENCOUNTER — Other Ambulatory Visit (HOSPITAL_COMMUNITY): Payer: Self-pay

## 2023-12-18 ENCOUNTER — Other Ambulatory Visit: Payer: Self-pay

## 2023-12-27 ENCOUNTER — Other Ambulatory Visit (HOSPITAL_COMMUNITY): Payer: Self-pay

## 2024-01-23 ENCOUNTER — Encounter (INDEPENDENT_AMBULATORY_CARE_PROVIDER_SITE_OTHER): Payer: Self-pay | Admitting: Family Medicine

## 2024-01-23 ENCOUNTER — Other Ambulatory Visit (HOSPITAL_COMMUNITY): Payer: Self-pay

## 2024-01-23 ENCOUNTER — Ambulatory Visit (INDEPENDENT_AMBULATORY_CARE_PROVIDER_SITE_OTHER): Payer: Commercial Managed Care - PPO | Admitting: Family Medicine

## 2024-01-23 VITALS — Ht 62.5 in | Wt 187.0 lb

## 2024-01-23 DIAGNOSIS — E1169 Type 2 diabetes mellitus with other specified complication: Secondary | ICD-10-CM

## 2024-01-23 DIAGNOSIS — E669 Obesity, unspecified: Secondary | ICD-10-CM

## 2024-01-23 DIAGNOSIS — Z7985 Long-term (current) use of injectable non-insulin antidiabetic drugs: Secondary | ICD-10-CM | POA: Diagnosis not present

## 2024-01-23 DIAGNOSIS — E119 Type 2 diabetes mellitus without complications: Secondary | ICD-10-CM

## 2024-01-23 DIAGNOSIS — Z6833 Body mass index (BMI) 33.0-33.9, adult: Secondary | ICD-10-CM

## 2024-01-23 MED ORDER — TIRZEPATIDE 10 MG/0.5ML ~~LOC~~ SOAJ
10.0000 mg | SUBCUTANEOUS | 0 refills | Status: DC
  Filled 2024-01-23 – 2024-02-14 (×5): qty 6, 84d supply, fill #0
  Filled 2024-02-26: qty 2, 28d supply, fill #0

## 2024-01-23 NOTE — Progress Notes (Signed)
 Office: [phone]  /  Fax: [phone]  WEIGHT SUMMARY AND BIOMETRICS  Anthropometric Measurements Height: 5' 2.5" (1.588 m) Weight: 187 lb (84.8 kg) BMI (Calculated): 33.64 Weight at Last Visit: 188 lb Weight Lost Since Last Visit: 1 lb Weight Gained Since Last Visit: 0 Starting Weight: 201 lb Total Weight Loss (lbs): 14 lb (6.35 kg)   Body Composition  Body Fat %: 41.6 % Fat Mass (lbs): 78 lbs Muscle Mass (lbs): 103.8 lbs Total Body Water (lbs): 75 lbs Visceral Fat Rating : 11   Other Clinical Data Fasting: No Labs: No Today's Visit #: 11 Starting Date: 05/15/23    Chief Complaint: OBESITY    History of Present Illness Miranda Williams is a 53 year old female with obesity and type 2 diabetes who presents for obesity treatment plan and progress assessment.  She is actively working on weight loss through dietary changes and exercise. She adheres to her category two eating plan approximately 50% of the time and has incorporated biking into her routine, riding for fifteen minutes twice a week. Despite these efforts, she has lost only one pound in the last six weeks since her last visit.  She has been experimenting with her diet, including meals such as a cottage cheese-based dish and a hamburger patty with mushrooms and onions. She remains determined to continue her efforts despite the modest weight loss.  Her type 2 diabetes is currently managed with Mounjaro 7.5 mg. Her most recent hemoglobin A1c is 5.7, a decrease from 7.3 previously. No episodes of hypoglycemia, except for one instance related to delayed eating.      PHYSICAL EXAM:  Height 5' 2.5" (1.588 m), weight 187 lb (84.8 kg). Body mass index is 33.66 kg/m.  DIAGNOSTIC DATA REVIEWED:  BMET    Component Value Date/Time   NA 135 09/14/2023 0929   NA 135 05/15/2023 0956   K 4.2 09/14/2023 0929   CL 102 09/14/2023 0929   CO2 22 09/14/2023 0929   GLUCOSE 102 (H) 09/14/2023 0929   BUN 19  09/14/2023 0929   BUN 13 05/15/2023 0956   CREATININE 0.77 09/14/2023 0929   CREATININE 0.59 12/21/2020 1421   CALCIUM 8.9 09/14/2023 0929   GFRNONAA >60 09/14/2023 0929   GFRAA >60 05/30/2019 0818   Lab Results  Component Value Date   HGBA1C 5.7 (H) 09/14/2023   HGBA1C 5.9 (H) 05/30/2019   Lab Results  Component Value Date   INSULIN 23.7 05/15/2023   Lab Results  Component Value Date   TSH 2.050 05/15/2023   CBC    Component Value Date/Time   WBC 8.3 05/15/2023 0956   WBC 7.7 12/21/2020 1421   RBC 4.71 05/15/2023 0956   RBC 4.31 12/21/2020 1421   HGB 13.7 05/15/2023 0956   HCT 41.0 05/15/2023 0956   PLT 293 05/15/2023 0956   MCV 87 05/15/2023 0956   MCH 29.1 05/15/2023 0956   MCH 29.5 12/21/2020 1421   MCHC 33.4 05/15/2023 0956   MCHC 34.1 12/21/2020 1421   RDW 11.9 05/15/2023 0956   Iron Studies No results found for: "IRON", "TIBC", "FERRITIN", "IRONPCTSAT" Lipid Panel     Component Value Date/Time   CHOL 168 05/15/2023 0956   TRIG 142 05/15/2023 0956   HDL 44 05/15/2023 0956   CHOLHDL 3.4 07/05/2022 1601   CHOLHDL 3.4 12/21/2020 1421   VLDL 14 05/30/2019 0819   LDLCALC 99 05/15/2023 0956   LDLCALC 85 12/21/2020 1421   Hepatic Function Panel  Component Value Date/Time   PROT 7.4 09/14/2023 0929   PROT 6.9 05/15/2023 0956   ALBUMIN 4.2 09/14/2023 0929   ALBUMIN 4.4 05/15/2023 0956   AST 25 09/14/2023 0929   ALT 35 09/14/2023 0929   ALKPHOS 71 09/14/2023 0929   BILITOT 0.7 09/14/2023 0929   BILITOT 0.3 05/15/2023 0956      Component Value Date/Time   TSH 2.050 05/15/2023 0956   Nutritional Lab Results  Component Value Date   VD25OH 107.88 (H) 09/14/2023   VD25OH 29.6 (L) 05/15/2023   VD25OH 27.1 (L) 05/30/2019     Assessment and Plan Assessment & Plan Type 2 Diabetes Mellitus Her type 2 diabetes is well-managed with Mounjaro 7.5 mg. Her hemoglobin A1c improved to 5.7 from 7.3, indicating good glycemic control. She experiences hunger  shortly after eating, and increasing protein intake can aid hunger control and insulin management. She has not experienced gastrointestinal issues with [facility] and has had no significant hypoglycemic episodes, except when delaying meals. Discussed the cost-effectiveness of a three-month supply of Mounjaro. - Increase Mounjaro dose to 10 mg. - Send prescription to [facility] for a three-month supply. - Advise taking Mounjaro after meals to minimize potential gastrointestinal upset.  Obesity She is addressing obesity with a category two eating plan, adhered to 50% of the time, and exercises by biking 15 minutes twice weekly. She lost one pound in six weeks, identified as fat loss. Emphasized maintaining a calorie range of 1100-1200 calories per day and a protein intake of at least 75 grams per day for weight loss and hunger control. Provided dietary advice, including a ten to one protein to calorie ratio, and recommended using My Fitness Pal for tracking. Highlighted the significance of protein in hunger and insulin control. - Advise maintaining a calorie intake of 1100-1200 calories per day. - Advise consuming at least 75 grams of protein per day. - Recommend using My Fitness Pal for dietary tracking. - Provide dietary advice including a ten to one protein to calorie ratio.  Follow-up Her next appointment is scheduled. Discussed the option of an additional appointment if needed. - Schedule an additional follow-up appointment if needed, preferably in the evenings.     I have personally spent 30 minutes total time today in preparation, patient care, and documentation for this visit, including the following: review of clinical lab tests; review of medical tests/procedures/services.    She was informed of the importance of frequent follow up visits to maximize her success with intensive lifestyle modifications for her multiple health conditions.    Quillian Quince, MD

## 2024-01-24 ENCOUNTER — Other Ambulatory Visit (HOSPITAL_COMMUNITY): Payer: Self-pay

## 2024-01-25 ENCOUNTER — Other Ambulatory Visit (HOSPITAL_COMMUNITY): Payer: Self-pay

## 2024-01-26 ENCOUNTER — Other Ambulatory Visit: Payer: Self-pay

## 2024-01-29 ENCOUNTER — Other Ambulatory Visit: Payer: Self-pay

## 2024-02-06 ENCOUNTER — Other Ambulatory Visit: Payer: Self-pay

## 2024-02-06 ENCOUNTER — Other Ambulatory Visit (HOSPITAL_COMMUNITY): Payer: Self-pay

## 2024-02-14 ENCOUNTER — Other Ambulatory Visit (HOSPITAL_COMMUNITY): Payer: Self-pay

## 2024-02-20 ENCOUNTER — Ambulatory Visit (INDEPENDENT_AMBULATORY_CARE_PROVIDER_SITE_OTHER): Payer: Commercial Managed Care - PPO | Admitting: Family Medicine

## 2024-02-26 ENCOUNTER — Other Ambulatory Visit: Payer: Self-pay

## 2024-02-26 ENCOUNTER — Other Ambulatory Visit (HOSPITAL_COMMUNITY): Payer: Self-pay

## 2024-02-28 ENCOUNTER — Other Ambulatory Visit (HOSPITAL_COMMUNITY): Payer: Self-pay

## 2024-02-28 ENCOUNTER — Other Ambulatory Visit: Payer: Self-pay

## 2024-03-19 ENCOUNTER — Encounter (INDEPENDENT_AMBULATORY_CARE_PROVIDER_SITE_OTHER): Payer: Self-pay | Admitting: Family Medicine

## 2024-03-19 ENCOUNTER — Ambulatory Visit (INDEPENDENT_AMBULATORY_CARE_PROVIDER_SITE_OTHER): Admitting: Family Medicine

## 2024-03-19 VITALS — BP 128/74 | HR 67 | Temp 98.1°F | Ht 62.5 in | Wt 189.0 lb

## 2024-03-19 DIAGNOSIS — Z6834 Body mass index (BMI) 34.0-34.9, adult: Secondary | ICD-10-CM | POA: Diagnosis not present

## 2024-03-19 DIAGNOSIS — E559 Vitamin D deficiency, unspecified: Secondary | ICD-10-CM | POA: Diagnosis not present

## 2024-03-19 DIAGNOSIS — F3289 Other specified depressive episodes: Secondary | ICD-10-CM

## 2024-03-19 DIAGNOSIS — E669 Obesity, unspecified: Secondary | ICD-10-CM

## 2024-03-19 DIAGNOSIS — E119 Type 2 diabetes mellitus without complications: Secondary | ICD-10-CM

## 2024-03-19 DIAGNOSIS — E1169 Type 2 diabetes mellitus with other specified complication: Secondary | ICD-10-CM

## 2024-03-19 DIAGNOSIS — Z7985 Long-term (current) use of injectable non-insulin antidiabetic drugs: Secondary | ICD-10-CM

## 2024-03-19 DIAGNOSIS — E785 Hyperlipidemia, unspecified: Secondary | ICD-10-CM | POA: Diagnosis not present

## 2024-03-19 DIAGNOSIS — E7849 Other hyperlipidemia: Secondary | ICD-10-CM

## 2024-03-19 MED ORDER — TIRZEPATIDE 10 MG/0.5ML ~~LOC~~ SOAJ
10.0000 mg | SUBCUTANEOUS | 0 refills | Status: DC
  Filled 2024-03-19: qty 6, 84d supply, fill #0

## 2024-03-19 MED ORDER — TIRZEPATIDE 12.5 MG/0.5ML ~~LOC~~ SOAJ
12.5000 mg | SUBCUTANEOUS | 0 refills | Status: DC
  Filled 2024-03-19: qty 6, 84d supply, fill #0
  Filled 2024-03-22: qty 2, 28d supply, fill #0
  Filled 2024-04-23: qty 2, 28d supply, fill #1

## 2024-03-19 NOTE — Progress Notes (Unsigned)
 Office: [phone]  /  Fax: [phone]  WEIGHT SUMMARY AND BIOMETRICS  Anthropometric Measurements Height: 5' 2.5" (1.588 m) Weight: 189 lb (85.7 kg) BMI (Calculated): 34 Weight at Last Visit: 187 lb Weight Lost Since Last Visit: 0 Weight Gained Since Last Visit: 2 lb Starting Weight: 201 lb Total Weight Loss (lbs): 12 lb (5.443 kg) Peak Weight: 230 lb   Body Composition  Body Fat %: 42.1 % Fat Mass (lbs): 79.6 lbs Muscle Mass (lbs): 103.8 lbs Total Body Water  (lbs): 76.2 lbs Visceral Fat Rating : 11   Other Clinical Data Fasting: no Labs: no Today's Visit #: 12 Starting Date: 05/15/23    Chief Complaint: OBESITY   History of Present Illness Miranda Williams is a 53 year old female with obesity who presents for a follow-up on her obesity treatment plan and progress assessment.  She has been attempting to maintain a food journal with a calorie goal of 1100-1200 calories and a protein intake of 75 grams or more daily, but meets these goals only about 10% of the time. She uses her stationary bike three times a week for about 10 minutes each session. Despite these efforts, she has gained two pounds over the last two months. She experiences hunger and cravings, which she attributes to temptations and possibly insufficient protein intake. She describes episodes of 'grazing' and 'eating like a vacuum cleaner,' often driven by cravings rather than hunger.  She has a history of vitamin D  deficiency and has been treated with prescription vitamin D  at a dose of 50,000 IU per week. Her most recent vitamin D  level in November was elevated at 107, leading to discontinuation of the supplement. She is due to have her labs repeated soon to check her current vitamin D  levels.  She has a history of type 2 diabetes, with her most recent A1c well controlled at 5.7. She is currently on Mounjaro  10 mg weekly and requests a refill.  She has a history of hyperlipidemia and is on Crestor  5  mg. She continues with diet and exercise as part of her weight loss efforts.  She reports work stress due to reduced hours and income, which may contribute to her emotional eating behaviors.      PHYSICAL EXAM:  Blood pressure 128/74, pulse 67, temperature 98.1 F (36.7 C), height 5' 2.5" (1.588 m), weight 189 lb (85.7 kg), SpO2 98%. Body mass index is 34.02 kg/m.  DIAGNOSTIC DATA REVIEWED:  BMET    Component Value Date/Time   NA 135 09/14/2023 0929   NA 135 05/15/2023 0956   K 4.2 09/14/2023 0929   CL 102 09/14/2023 0929   CO2 22 09/14/2023 0929   GLUCOSE 102 (H) 09/14/2023 0929   BUN 19 09/14/2023 0929   BUN 13 05/15/2023 0956   CREATININE 0.77 09/14/2023 0929   CREATININE 0.59 12/21/2020 1421   CALCIUM  8.9 09/14/2023 0929   GFRNONAA >60 09/14/2023 0929   GFRAA >60 05/30/2019 0818   Lab Results  Component Value Date   HGBA1C 5.7 (H) 09/14/2023   HGBA1C 5.9 (H) 05/30/2019   Lab Results  Component Value Date   INSULIN  23.7 05/15/2023   Lab Results  Component Value Date   TSH 2.050 05/15/2023   CBC    Component Value Date/Time   WBC 8.3 05/15/2023 0956   WBC 7.7 12/21/2020 1421   RBC 4.71 05/15/2023 0956   RBC 4.31 12/21/2020 1421   HGB 13.7 05/15/2023 0956   HCT 41.0 05/15/2023 0956  PLT 293 05/15/2023 0956   MCV 87 05/15/2023 0956   MCH 29.1 05/15/2023 0956   MCH 29.5 12/21/2020 1421   MCHC 33.4 05/15/2023 0956   MCHC 34.1 12/21/2020 1421   RDW 11.9 05/15/2023 0956   Iron Studies No results found for: "IRON", "TIBC", "FERRITIN", "IRONPCTSAT" Lipid Panel     Component Value Date/Time   CHOL 168 05/15/2023 0956   TRIG 142 05/15/2023 0956   HDL 44 05/15/2023 0956   CHOLHDL 3.4 07/05/2022 1601   CHOLHDL 3.4 12/21/2020 1421   VLDL 14 05/30/2019 0819   LDLCALC 99 05/15/2023 0956   LDLCALC 85 12/21/2020 1421   Hepatic Function Panel     Component Value Date/Time   PROT 7.4 09/14/2023 0929   PROT 6.9 05/15/2023 0956   ALBUMIN 4.2 09/14/2023  0929   ALBUMIN 4.4 05/15/2023 0956   AST 25 09/14/2023 0929   ALT 35 09/14/2023 0929   ALKPHOS 71 09/14/2023 0929   BILITOT 0.7 09/14/2023 0929   BILITOT 0.3 05/15/2023 0956      Component Value Date/Time   TSH 2.050 05/15/2023 0956   Nutritional Lab Results  Component Value Date   VD25OH 107.88 (H) 09/14/2023   VD25OH 29.6 (L) 05/15/2023   VD25OH 27.1 (L) 05/30/2019     Assessment and Plan Assessment & Plan Obesity Obesity management is ongoing with a calorie goal of 1100-1200 calories per day and a minimum of 75 grams of protein. She reports difficulty adhering to this plan, achieving it only 10% of the time. Exercise includes using a stationary bike three times a week for 10 minutes. Weight has increased by 2 pounds over the last two months. Emotional eating and cravings are significant challenges, potentially due to neurologic hunger and stress. Mounjaro  (tirzepatide ) is currently at 10 mg weekly, with a plan to increase to 12.5 mg to better manage hunger and weight. Discussed the role of neurotransmitters in cravings and potential non-pharmacological strategies to manage them, such as engaging in enjoyable, productive, or creative activities. If cravings persist, medications targeting neurotransmitter recycling may be considered. - Increase Mounjaro  to 12.5 mg weekly. - Ensure protein intake meets or exceeds 75 grams daily. - Track emotional eating and cravings in a journal. - Engage in enjoyable, productive, or creative activities to manage cravings. - Consider medications targeting neurotransmitter recycling if cravings persist.  Type 2 diabetes mellitus, well-controlled Type 2 diabetes is well-controlled with a recent A1c of 5.7%. Current management includes Mounjaro  (tirzepatide ) and lifestyle modifications. - Continue Mounjaro  as adjusted for obesity management.  Hyperlipidemia Hyperlipidemia managed with Crestor  (rosuvastatin ) 5 mg daily. Due for fasting lipid panel as  part of routine monitoring. - Continue Crestor  5 mg daily. - Order fasting lipid panel. - Encourage continued diet, exercise, and weight loss efforts.  Vitamin D  deficiency Vitamin D  deficiency previously treated with high-dose vitamin D . Recent levels were elevated, and vitamin D  supplementation was stopped. Due for repeat labs to assess current vitamin D  levels and determine if further supplementation is needed. - Order fasting labs to include vitamin D  levels. - Discuss lab results at next visit to determine need for further vitamin D  supplementation.    She was informed of the importance of frequent follow up visits to maximize her success with intensive lifestyle modifications for her multiple health conditions.    Jasmine Mesi, MD

## 2024-03-20 ENCOUNTER — Other Ambulatory Visit (HOSPITAL_COMMUNITY): Payer: Self-pay

## 2024-03-22 ENCOUNTER — Other Ambulatory Visit: Payer: Self-pay

## 2024-03-22 ENCOUNTER — Other Ambulatory Visit (HOSPITAL_COMMUNITY): Payer: Self-pay

## 2024-03-26 ENCOUNTER — Other Ambulatory Visit (HOSPITAL_COMMUNITY): Payer: Self-pay

## 2024-03-26 DIAGNOSIS — E559 Vitamin D deficiency, unspecified: Secondary | ICD-10-CM | POA: Diagnosis not present

## 2024-03-26 DIAGNOSIS — E1169 Type 2 diabetes mellitus with other specified complication: Secondary | ICD-10-CM | POA: Diagnosis not present

## 2024-03-26 DIAGNOSIS — E7849 Other hyperlipidemia: Secondary | ICD-10-CM | POA: Diagnosis not present

## 2024-03-27 LAB — CMP14+EGFR
ALT: 22 IU/L (ref 0–32)
AST: 15 IU/L (ref 0–40)
Albumin: 4.5 g/dL (ref 3.8–4.9)
Alkaline Phosphatase: 91 IU/L (ref 44–121)
BUN/Creatinine Ratio: 19 (ref 9–23)
BUN: 15 mg/dL (ref 6–24)
Bilirubin Total: 0.4 mg/dL (ref 0.0–1.2)
CO2: 19 mmol/L — ABNORMAL LOW (ref 20–29)
Calcium: 10.2 mg/dL (ref 8.7–10.2)
Chloride: 103 mmol/L (ref 96–106)
Creatinine, Ser: 0.77 mg/dL (ref 0.57–1.00)
Globulin, Total: 2.3 g/dL (ref 1.5–4.5)
Glucose: 87 mg/dL (ref 70–99)
Potassium: 4.7 mmol/L (ref 3.5–5.2)
Sodium: 137 mmol/L (ref 134–144)
Total Protein: 6.8 g/dL (ref 6.0–8.5)
eGFR: 92 mL/min/{1.73_m2} (ref >59)

## 2024-03-27 LAB — LIPID PANEL WITH LDL/HDL RATIO
Cholesterol, Total: 136 mg/dL (ref 100–199)
HDL: 44 mg/dL (ref >39)
LDL Chol Calc (NIH): 69 mg/dL (ref 0–99)
LDL/HDL Ratio: 1.6 ratio (ref 0.0–3.2)
Triglycerides: 132 mg/dL (ref 0–149)
VLDL Cholesterol Cal: 23 mg/dL (ref 5–40)

## 2024-03-27 LAB — INSULIN, RANDOM: INSULIN: 18.6 u[IU]/mL (ref 2.6–24.9)

## 2024-03-27 LAB — HEMOGLOBIN A1C
Est. average glucose Bld gHb Est-mCnc: 114 mg/dL
Hgb A1c MFr Bld: 5.6 % (ref 4.8–5.6)

## 2024-03-27 LAB — VITAMIN D 25 HYDROXY (VIT D DEFICIENCY, FRACTURES): Vit D, 25-Hydroxy: 40.6 ng/mL (ref 30.0–100.0)

## 2024-03-27 LAB — VITAMIN B12: Vitamin B-12: 624 pg/mL (ref 232–1245)

## 2024-03-29 ENCOUNTER — Other Ambulatory Visit (HOSPITAL_COMMUNITY): Payer: Self-pay

## 2024-04-01 ENCOUNTER — Other Ambulatory Visit (HOSPITAL_COMMUNITY): Payer: Self-pay

## 2024-04-02 ENCOUNTER — Other Ambulatory Visit (HOSPITAL_COMMUNITY): Payer: Self-pay

## 2024-04-03 ENCOUNTER — Other Ambulatory Visit (HOSPITAL_COMMUNITY): Payer: Self-pay

## 2024-04-03 ENCOUNTER — Other Ambulatory Visit: Payer: Self-pay

## 2024-04-03 MED ORDER — ROSUVASTATIN CALCIUM 5 MG PO TABS
5.0000 mg | ORAL_TABLET | Freq: Every evening | ORAL | 0 refills | Status: DC
  Filled 2024-04-03: qty 90, 90d supply, fill #0

## 2024-04-03 MED ORDER — FLUOXETINE HCL 40 MG PO CAPS
40.0000 mg | ORAL_CAPSULE | Freq: Every day | ORAL | 1 refills | Status: AC
  Filled 2024-04-23: qty 90, 90d supply, fill #0
  Filled 2024-07-19: qty 90, 90d supply, fill #1

## 2024-04-03 MED ORDER — SPIRONOLACTONE 50 MG PO TABS
50.0000 mg | ORAL_TABLET | Freq: Every day | ORAL | 1 refills | Status: DC
  Filled 2024-04-23: qty 30, 30d supply, fill #0
  Filled 2024-05-20: qty 30, 30d supply, fill #1

## 2024-04-23 ENCOUNTER — Other Ambulatory Visit (HOSPITAL_COMMUNITY): Payer: Self-pay

## 2024-04-24 ENCOUNTER — Other Ambulatory Visit: Payer: Self-pay

## 2024-04-26 ENCOUNTER — Other Ambulatory Visit (HOSPITAL_COMMUNITY): Payer: Self-pay

## 2024-04-26 MED ORDER — ROSUVASTATIN CALCIUM 5 MG PO TABS
5.0000 mg | ORAL_TABLET | Freq: Every evening | ORAL | 0 refills | Status: DC
  Filled 2024-04-26 – 2024-06-18 (×2): qty 90, 90d supply, fill #0

## 2024-04-30 ENCOUNTER — Other Ambulatory Visit (HOSPITAL_COMMUNITY): Payer: Self-pay

## 2024-04-30 ENCOUNTER — Ambulatory Visit (INDEPENDENT_AMBULATORY_CARE_PROVIDER_SITE_OTHER): Admitting: Family Medicine

## 2024-04-30 ENCOUNTER — Encounter (INDEPENDENT_AMBULATORY_CARE_PROVIDER_SITE_OTHER): Payer: Self-pay | Admitting: Family Medicine

## 2024-04-30 VITALS — BP 115/70 | HR 64 | Temp 98.4°F | Ht 62.5 in | Wt 183.0 lb

## 2024-04-30 DIAGNOSIS — E559 Vitamin D deficiency, unspecified: Secondary | ICD-10-CM

## 2024-04-30 DIAGNOSIS — Z6832 Body mass index (BMI) 32.0-32.9, adult: Secondary | ICD-10-CM | POA: Diagnosis not present

## 2024-04-30 DIAGNOSIS — Z6833 Body mass index (BMI) 33.0-33.9, adult: Secondary | ICD-10-CM

## 2024-04-30 DIAGNOSIS — E785 Hyperlipidemia, unspecified: Secondary | ICD-10-CM

## 2024-04-30 DIAGNOSIS — E669 Obesity, unspecified: Secondary | ICD-10-CM

## 2024-04-30 DIAGNOSIS — Z794 Long term (current) use of insulin: Secondary | ICD-10-CM

## 2024-04-30 DIAGNOSIS — E1169 Type 2 diabetes mellitus with other specified complication: Secondary | ICD-10-CM

## 2024-04-30 DIAGNOSIS — E119 Type 2 diabetes mellitus without complications: Secondary | ICD-10-CM | POA: Diagnosis not present

## 2024-04-30 DIAGNOSIS — Z7985 Long-term (current) use of injectable non-insulin antidiabetic drugs: Secondary | ICD-10-CM | POA: Diagnosis not present

## 2024-04-30 DIAGNOSIS — E7849 Other hyperlipidemia: Secondary | ICD-10-CM

## 2024-04-30 MED ORDER — TIRZEPATIDE 12.5 MG/0.5ML ~~LOC~~ SOAJ
12.5000 mg | SUBCUTANEOUS | 0 refills | Status: DC
  Filled 2024-04-30 – 2024-05-17 (×3): qty 6, 84d supply, fill #0

## 2024-04-30 NOTE — Progress Notes (Signed)
 Office: [phone]  /  Fax: [phone]  WEIGHT SUMMARY AND BIOMETRICS  Anthropometric Measurements Height: 5' 2.5 (1.588 m) Weight: 183 lb (83 kg) BMI (Calculated): 32.92 Weight at Last Visit: 189 lb Weight Lost Since Last Visit: 6 lb Weight Gained Since Last Visit: 0 Starting Weight: 201 lb Total Weight Loss (lbs): 18 lb (8.165 kg) Peak Weight: 230 lb   Body Composition  Body Fat %: 3.7 % Fat Mass (lbs): 63.8 lbs Muscle Mass (lbs): 114 lbs Total Body Water  (lbs): 74.2 lbs Visceral Fat Rating : 9   Other Clinical Data Fasting: No Labs: No Today's Visit #: 13 Starting Date: 05/15/23    Chief Complaint: OBESITY   History of Present Illness Miranda Williams is a 53 year old female with obesity and type 2 diabetes who presents for obesity treatment.  She is actively engaged in her obesity treatment, following a category two eating plan with a success rate of 60-70%. She exercises using an elliptical stepper for 30 minutes, four times a week, and has lost six pounds over the last six weeks. She has improved portion control and reduced snacking, stating she is no longer hungry and can resist sweets at work. She is planning to manage her diet during upcoming events like the Fourth of July and a beach trip to maintain her progress.  She is currently on Mounjaro  for type 2 diabetes and reports no issues with hypoglycemia. Her A1c was 5.6% a month ago. She is not skipping meals and has noticed a reduced need for snacks between meals.  For hyperlipidemia, she is managing her condition through diet and exercise and is on Crestor . Recent labs show her LDL has decreased from 99 to 69.  She has a history of vitamin D  deficiency, with her previous level at 40, down from 107 in November.  She engages in physical activities such as using an elliptical stepper and doing water  exercises at the pool on Sundays. She acknowledges that the elliptical stepper is a 'little cheating' but  still feels it contributes to her exercise routine.      PHYSICAL EXAM:  Blood pressure 115/70, pulse 64, temperature 98.4 F (36.9 C), height 5' 2.5 (1.588 m), weight 183 lb (83 kg), SpO2 98%. Body mass index is 32.94 kg/m.  DIAGNOSTIC DATA REVIEWED:  BMET    Component Value Date/Time   NA 137 03/26/2024 0759   K 4.7 03/26/2024 0759   CL 103 03/26/2024 0759   CO2 19 (L) 03/26/2024 0759   GLUCOSE 87 03/26/2024 0759   GLUCOSE 102 (H) 09/14/2023 0929   BUN 15 03/26/2024 0759   CREATININE 0.77 03/26/2024 0759   CREATININE 0.59 12/21/2020 1421   CALCIUM  10.2 03/26/2024 0759   GFRNONAA >60 09/14/2023 0929   GFRAA >60 05/30/2019 0818   Lab Results  Component Value Date   HGBA1C 5.6 03/26/2024   HGBA1C 5.9 (H) 05/30/2019   Lab Results  Component Value Date   INSULIN  18.6 03/26/2024   INSULIN  23.7 05/15/2023   Lab Results  Component Value Date   TSH 2.050 05/15/2023   CBC    Component Value Date/Time   WBC 8.3 05/15/2023 0956   WBC 7.7 12/21/2020 1421   RBC 4.71 05/15/2023 0956   RBC 4.31 12/21/2020 1421   HGB 13.7 05/15/2023 0956   HCT 41.0 05/15/2023 0956   PLT 293 05/15/2023 0956   MCV 87 05/15/2023 0956   MCH 29.1 05/15/2023 0956   MCH 29.5 12/21/2020 1421   MCHC  33.4 05/15/2023 0956   MCHC 34.1 12/21/2020 1421   RDW 11.9 05/15/2023 0956   Iron Studies No results found for: IRON, TIBC, FERRITIN, IRONPCTSAT Lipid Panel     Component Value Date/Time   CHOL 136 03/26/2024 0759   TRIG 132 03/26/2024 0759   HDL 44 03/26/2024 0759   CHOLHDL 3.4 07/05/2022 1601   CHOLHDL 3.4 12/21/2020 1421   VLDL 14 05/30/2019 0819   LDLCALC 69 03/26/2024 0759   LDLCALC 85 12/21/2020 1421   Hepatic Function Panel     Component Value Date/Time   PROT 6.8 03/26/2024 0759   ALBUMIN 4.5 03/26/2024 0759   AST 15 03/26/2024 0759   ALT 22 03/26/2024 0759   ALKPHOS 91 03/26/2024 0759   BILITOT 0.4 03/26/2024 0759      Component Value Date/Time   TSH 2.050  05/15/2023 0956   Nutritional Lab Results  Component Value Date   VD25OH 40.6 03/26/2024   VD25OH 107.88 (H) 09/14/2023   VD25OH 29.6 (L) 05/15/2023     Assessment and Plan Assessment & Plan Obesity She is actively managing her obesity with a category two eating plan, achieving a 60-70% success rate, and has lost six pounds in six weeks. Her exercise regimen includes 30 minutes on an elliptical stepper four times a week and water  exercise sessions. She practices portion control and makes healthier food choices, such as reducing fries and sweets. Emphasis is on gradual weight loss and learning from setbacks. The current regimen is effective. - Continue category two eating plan - Maintain current exercise regimen - Encourage portion control and healthier food choices - Reinforce gradual weight loss and learning from setbacks  Type 2 Diabetes Mellitus Her A1c is 5.6, indicating well-controlled blood sugar. Mounjaro  is effectively managing her blood sugar levels without hypoglycemia, and she maintains regular meals. - Continue Mounjaro  as prescribed - Maintain regular meals  Hyperlipidemia Her cholesterol levels have improved significantly, with LDL decreasing from 99 to 69. She manages hyperlipidemia through diet, exercise, and Crestor . Improvement is attributed to dietary changes. - Continue Crestor  as prescribed - Maintain dietary changes and exercise regimen  Vitamin D  Deficiency Her vitamin D  levels have decreased from 107 to 40, slightly below the desired range of 50. Discontinuation of prescription vitamin D  is recommended, and she will start an over-the-counter supplement. - Discontinue prescription vitamin D  - Start over-the-counter vitamin D  1000 IU daily - Monitor vitamin D  levels in 3-4 months  General Health Maintenance She is advised to stay hydrated, especially in heat, and continue her exercise routine. Strategies for managing dietary temptations during special  occasions and vacations are discussed, emphasizing moderation and healthier choices. She is encouraged to replace high-calorie treats with healthier alternatives, such as protein pudding. - Stay hydrated, especially in hot weather - Continue exercise routine - Practice moderation and healthier choices during special occasions and vacations  Follow-up She has a follow-up appointment scheduled for August 11th, providing accountability after her vacation. - Follow up in 5-6 weeks on August 11th      She was informed of the importance of frequent follow up visits to maximize her success with intensive lifestyle modifications for her multiple health conditions.    Louann Penton, MD

## 2024-05-01 ENCOUNTER — Other Ambulatory Visit (HOSPITAL_COMMUNITY): Payer: Self-pay

## 2024-05-13 ENCOUNTER — Other Ambulatory Visit (HOSPITAL_COMMUNITY): Payer: Self-pay

## 2024-05-13 ENCOUNTER — Other Ambulatory Visit: Payer: Self-pay

## 2024-05-17 ENCOUNTER — Other Ambulatory Visit: Payer: Self-pay

## 2024-05-17 ENCOUNTER — Other Ambulatory Visit (HOSPITAL_COMMUNITY): Payer: Self-pay

## 2024-05-20 ENCOUNTER — Other Ambulatory Visit: Payer: Self-pay

## 2024-06-10 ENCOUNTER — Ambulatory Visit (INDEPENDENT_AMBULATORY_CARE_PROVIDER_SITE_OTHER): Admitting: Family Medicine

## 2024-06-10 ENCOUNTER — Encounter (INDEPENDENT_AMBULATORY_CARE_PROVIDER_SITE_OTHER): Payer: Self-pay | Admitting: Family Medicine

## 2024-06-10 VITALS — BP 108/71 | HR 69 | Temp 98.2°F | Ht 62.5 in | Wt 184.0 lb

## 2024-06-10 DIAGNOSIS — E119 Type 2 diabetes mellitus without complications: Secondary | ICD-10-CM

## 2024-06-10 DIAGNOSIS — Z7985 Long-term (current) use of injectable non-insulin antidiabetic drugs: Secondary | ICD-10-CM | POA: Diagnosis not present

## 2024-06-10 DIAGNOSIS — Z6832 Body mass index (BMI) 32.0-32.9, adult: Secondary | ICD-10-CM

## 2024-06-10 DIAGNOSIS — E669 Obesity, unspecified: Secondary | ICD-10-CM

## 2024-06-10 DIAGNOSIS — E66812 Obesity, class 2: Secondary | ICD-10-CM

## 2024-06-10 DIAGNOSIS — E1169 Type 2 diabetes mellitus with other specified complication: Secondary | ICD-10-CM

## 2024-06-10 DIAGNOSIS — Z6833 Body mass index (BMI) 33.0-33.9, adult: Secondary | ICD-10-CM | POA: Diagnosis not present

## 2024-06-10 NOTE — Progress Notes (Signed)
 Office: [phone]  /  Fax: [phone]  WEIGHT SUMMARY AND BIOMETRICS  Anthropometric Measurements Height: 5' 2.5 (1.588 m) Weight: 184 lb (83.5 kg) BMI (Calculated): 33.1 Weight at Last Visit: 183 lb Weight Lost Since Last Visit: 0 Weight Gained Since Last Visit: 1 lb Starting Weight: 201 lb Total Weight Loss (lbs): 17 lb (7.711 kg) Peak Weight: 230 lb   Body Composition  Body Fat %: 36.6 % Fat Mass (lbs): 67.4 lbs Muscle Mass (lbs): 111 lbs Total Body Water  (lbs): 74.6 lbs Visceral Fat Rating : 9   Other Clinical Data Fasting: no Labs: no Today's Visit #: 14 Starting Date: 05/15/23    Chief Complaint: OBESITY  History of Present Illness Miranda Williams is a 53 year old female with obesity and type 2 diabetes who presents to discuss her obesity treatment and assess her progress.  She has been adhering to the category two eating plan about fifty percent of the time and engages in physical activity by walking for thirty minutes twice a week. Despite these efforts, she has gained one pound over the last five weeks since her previous visit.  She recently returned from an eight-day beach vacation where she indulged in various foods. During this time, she noticed a change in her taste preferences, favoring steamed fish over fried due to queasiness after consuming fried fish. She also experienced a decreased enjoyment of potatoes compared to the past.  She does not monitor her blood sugar levels but feels they are stable. She is currently taking Mounjaro . She received a ninety-day refill of Mounjaro  before her vacation and is satisfied with the current prescription arrangement.  Regarding physical activity, she plans to increase her exercise by riding a stationary bicycle more frequently. She experienced some exercise intolerance during her vacation, feeling winded, which she attributes to her weight.  She is mindful of her eating schedule, ensuring she does not go too  long without eating, even if she does not feel hungry. She is trying to incorporate more vegetables into her diet, despite not always feeling inclined to eat them.      PHYSICAL EXAM:  Blood pressure 108/71, pulse 69, temperature 98.2 F (36.8 C), height 5' 2.5 (1.588 m), weight 184 lb (83.5 kg), SpO2 98%. Body mass index is 33.12 kg/m.  DIAGNOSTIC DATA REVIEWED:  BMET    Component Value Date/Time   NA 137 03/26/2024 0759   K 4.7 03/26/2024 0759   CL 103 03/26/2024 0759   CO2 19 (L) 03/26/2024 0759   GLUCOSE 87 03/26/2024 0759   GLUCOSE 102 (H) 09/14/2023 0929   BUN 15 03/26/2024 0759   CREATININE 0.77 03/26/2024 0759   CREATININE 0.59 12/21/2020 1421   CALCIUM  10.2 03/26/2024 0759   GFRNONAA >60 09/14/2023 0929   GFRAA >60 05/30/2019 0818   Lab Results  Component Value Date   HGBA1C 5.6 03/26/2024   HGBA1C 5.9 (H) 05/30/2019   Lab Results  Component Value Date   INSULIN  18.6 03/26/2024   INSULIN  23.7 05/15/2023   Lab Results  Component Value Date   TSH 2.050 05/15/2023   CBC    Component Value Date/Time   WBC 8.3 05/15/2023 0956   WBC 7.7 12/21/2020 1421   RBC 4.71 05/15/2023 0956   RBC 4.31 12/21/2020 1421   HGB 13.7 05/15/2023 0956   HCT 41.0 05/15/2023 0956   PLT 293 05/15/2023 0956   MCV 87 05/15/2023 0956   MCH 29.1 05/15/2023 0956   MCH 29.5 12/21/2020 1421  MCHC 33.4 05/15/2023 0956   MCHC 34.1 12/21/2020 1421   RDW 11.9 05/15/2023 0956   Iron Studies No results found for: IRON, TIBC, FERRITIN, IRONPCTSAT Lipid Panel     Component Value Date/Time   CHOL 136 03/26/2024 0759   TRIG 132 03/26/2024 0759   HDL 44 03/26/2024 0759   CHOLHDL 3.4 07/05/2022 1601   CHOLHDL 3.4 12/21/2020 1421   VLDL 14 05/30/2019 0819   LDLCALC 69 03/26/2024 0759   LDLCALC 85 12/21/2020 1421   Hepatic Function Panel     Component Value Date/Time   PROT 6.8 03/26/2024 0759   ALBUMIN 4.5 03/26/2024 0759   AST 15 03/26/2024 0759   ALT 22 03/26/2024  0759   ALKPHOS 91 03/26/2024 0759   BILITOT 0.4 03/26/2024 0759      Component Value Date/Time   TSH 2.050 05/15/2023 0956   Nutritional Lab Results  Component Value Date   VD25OH 40.6 03/26/2024   VD25OH 107.88 (H) 09/14/2023   VD25OH 29.6 (L) 05/15/2023     Assessment and Plan Assessment & Plan Obesity Obesity management is ongoing. She has been following the category two eating plan about 50% of the time and has gained one pound in the last five weeks. She reports a change in taste preferences, favoring steamed over fried foods, and has been making better dietary choices. She is exercising by walking 30 minutes twice a week and plans to increase her activity by riding a stationary bicycle for at least 10 minutes most days of the week. - Encourage adherence to the category two eating plan. - Advise increasing physical activity by riding a stationary bicycle for at least 10 minutes most days of the week.  Type 2 diabetes mellitus Type 2 diabetes mellitus is being managed with Mounjaro . She reports no issues with blood sugar levels and does not check them regularly. Discussed the importance of monitoring for low blood sugars, especially with increased exercise. - Continue Mounjaro  as prescribed. - Monitor for symptoms of low blood sugar, especially with increased exercise. - Continue diet, exercise and weight loss as discussed today as an important part of the treatment plan     She was informed of the importance of frequent follow up visits to maximize her success with intensive lifestyle modifications for her multiple health conditions.    Louann Penton, MD

## 2024-06-18 ENCOUNTER — Other Ambulatory Visit (HOSPITAL_COMMUNITY): Payer: Self-pay

## 2024-06-18 ENCOUNTER — Other Ambulatory Visit: Payer: Self-pay

## 2024-06-18 MED ORDER — SPIRONOLACTONE 50 MG PO TABS
50.0000 mg | ORAL_TABLET | Freq: Every day | ORAL | 1 refills | Status: DC
  Filled 2024-06-18: qty 30, 30d supply, fill #0
  Filled 2024-07-18: qty 30, 30d supply, fill #1

## 2024-06-26 DIAGNOSIS — H5203 Hypermetropia, bilateral: Secondary | ICD-10-CM | POA: Diagnosis not present

## 2024-07-18 ENCOUNTER — Other Ambulatory Visit (HOSPITAL_COMMUNITY): Payer: Self-pay

## 2024-07-19 ENCOUNTER — Other Ambulatory Visit (HOSPITAL_COMMUNITY): Payer: Self-pay

## 2024-07-22 ENCOUNTER — Ambulatory Visit (INDEPENDENT_AMBULATORY_CARE_PROVIDER_SITE_OTHER): Admitting: Family Medicine

## 2024-08-12 ENCOUNTER — Other Ambulatory Visit: Payer: Self-pay

## 2024-08-12 ENCOUNTER — Other Ambulatory Visit (HOSPITAL_COMMUNITY): Payer: Self-pay

## 2024-08-12 MED ORDER — SPIRONOLACTONE 50 MG PO TABS
50.0000 mg | ORAL_TABLET | Freq: Every day | ORAL | 1 refills | Status: DC
  Filled 2024-08-12: qty 30, 30d supply, fill #0
  Filled 2024-09-12: qty 30, 30d supply, fill #1

## 2024-08-13 ENCOUNTER — Other Ambulatory Visit (HOSPITAL_COMMUNITY): Payer: Self-pay | Admitting: Family Medicine

## 2024-08-13 DIAGNOSIS — Z1231 Encounter for screening mammogram for malignant neoplasm of breast: Secondary | ICD-10-CM

## 2024-08-14 ENCOUNTER — Encounter (INDEPENDENT_AMBULATORY_CARE_PROVIDER_SITE_OTHER): Payer: Self-pay | Admitting: Gastroenterology

## 2024-08-16 ENCOUNTER — Ambulatory Visit (HOSPITAL_COMMUNITY): Admission: RE | Admit: 2024-08-16 | Discharge: 2024-08-16 | Disposition: A | Source: Ambulatory Visit

## 2024-08-16 ENCOUNTER — Encounter (HOSPITAL_COMMUNITY): Payer: Self-pay

## 2024-08-16 DIAGNOSIS — Z1231 Encounter for screening mammogram for malignant neoplasm of breast: Secondary | ICD-10-CM | POA: Insufficient documentation

## 2024-08-20 ENCOUNTER — Other Ambulatory Visit: Payer: Self-pay

## 2024-08-20 ENCOUNTER — Ambulatory Visit (INDEPENDENT_AMBULATORY_CARE_PROVIDER_SITE_OTHER): Admitting: Family Medicine

## 2024-08-20 ENCOUNTER — Encounter (INDEPENDENT_AMBULATORY_CARE_PROVIDER_SITE_OTHER): Payer: Self-pay | Admitting: Family Medicine

## 2024-08-20 VITALS — BP 102/65 | HR 77 | Temp 97.5°F | Ht 62.5 in | Wt 183.0 lb

## 2024-08-20 DIAGNOSIS — E119 Type 2 diabetes mellitus without complications: Secondary | ICD-10-CM | POA: Diagnosis not present

## 2024-08-20 DIAGNOSIS — Z7985 Long-term (current) use of injectable non-insulin antidiabetic drugs: Secondary | ICD-10-CM

## 2024-08-20 DIAGNOSIS — E669 Obesity, unspecified: Secondary | ICD-10-CM

## 2024-08-20 DIAGNOSIS — Z6832 Body mass index (BMI) 32.0-32.9, adult: Secondary | ICD-10-CM | POA: Diagnosis not present

## 2024-08-20 DIAGNOSIS — E1169 Type 2 diabetes mellitus with other specified complication: Secondary | ICD-10-CM

## 2024-08-20 MED ORDER — TIRZEPATIDE 12.5 MG/0.5ML ~~LOC~~ SOAJ
12.5000 mg | SUBCUTANEOUS | 0 refills | Status: DC
  Filled 2024-08-20: qty 6, 84d supply, fill #0

## 2024-08-20 NOTE — Progress Notes (Signed)
 Office: [phone]  /  Fax: [phone]  WEIGHT SUMMARY AND BIOMETRICS  Anthropometric Measurements Height: 5' 2.5 (1.588 m) Weight: 183 lb (83 kg) BMI (Calculated): 32.92 Weight at Last Visit: 184 lb Weight Lost Since Last Visit: 1 lb Weight Gained Since Last Visit: 0 Starting Weight: 201 lb Total Weight Loss (lbs): 18 lb (8.165 kg) Peak Weight: 220 lb   Body Composition  Body Fat %: 41.2 % Fat Mass (lbs): 75.4 lbs Muscle Mass (lbs): 102.4 lbs Total Body Water  (lbs): 74.6 lbs Visceral Fat Rating : 11   Other Clinical Data Fasting: no Labs: no Today's Visit #: 15 Starting Date: 05/15/23    Chief Complaint: OBESITY    History of Present Illness Miranda Williams is a 53 year old female who presents for obesity treatment and progress assessment.  She is following a category two eating plan but adheres to it only 10% of the time. She attempts to track calories and increase her intake of fruits and vegetables but struggles to meet protein recommendations and hydration goals. She ensures she gets at least seven hours of sleep per night and engages in walking for exercise for 30 minutes, two to three days a week. She has lost one pound in the last two months.  She experiences cravings, particularly for carbohydrates, which she associates with increased stress levels. These cravings are described as 'sweet, salty, bready' and tend to increase when her stress levels rise.  She is currently on Mounjaro  at a dose of 12.5 mg and reports no issues with the medication. Her blood sugars have not been affected by the medication. She is mostly getting her protein intake but notes occasional difficulty in doing so.  Her previous metabolic testing showed a caloric intake of 1,483 calories per day, which was close to the 1,490 calories per day measured as necessary to maintain her weight.      PHYSICAL EXAM:  Blood pressure 102/65, pulse 77, temperature (!) 97.5 F (36.4 C),  height 5' 2.5 (1.588 m), weight 183 lb (83 kg), SpO2 96%. Body mass index is 32.94 kg/m.  DIAGNOSTIC DATA REVIEWED:  BMET    Component Value Date/Time   NA 137 03/26/2024 0759   K 4.7 03/26/2024 0759   CL 103 03/26/2024 0759   CO2 19 (L) 03/26/2024 0759   GLUCOSE 87 03/26/2024 0759   GLUCOSE 102 (H) 09/14/2023 0929   BUN 15 03/26/2024 0759   CREATININE 0.77 03/26/2024 0759   CREATININE 0.59 12/21/2020 1421   CALCIUM  10.2 03/26/2024 0759   GFRNONAA >60 09/14/2023 0929   GFRAA >60 05/30/2019 0818   Lab Results  Component Value Date   HGBA1C 5.6 03/26/2024   HGBA1C 5.9 (H) 05/30/2019   Lab Results  Component Value Date   INSULIN  18.6 03/26/2024   INSULIN  23.7 05/15/2023   Lab Results  Component Value Date   TSH 2.050 05/15/2023   CBC    Component Value Date/Time   WBC 8.3 05/15/2023 0956   WBC 7.7 12/21/2020 1421   RBC 4.71 05/15/2023 0956   RBC 4.31 12/21/2020 1421   HGB 13.7 05/15/2023 0956   HCT 41.0 05/15/2023 0956   PLT 293 05/15/2023 0956   MCV 87 05/15/2023 0956   MCH 29.1 05/15/2023 0956   MCH 29.5 12/21/2020 1421   MCHC 33.4 05/15/2023 0956   MCHC 34.1 12/21/2020 1421   RDW 11.9 05/15/2023 0956   Iron Studies No results found for: IRON, TIBC, FERRITIN, IRONPCTSAT Lipid Panel  Component Value Date/Time   CHOL 136 03/26/2024 0759   TRIG 132 03/26/2024 0759   HDL 44 03/26/2024 0759   CHOLHDL 3.4 07/05/2022 1601   CHOLHDL 3.4 12/21/2020 1421   VLDL 14 05/30/2019 0819   LDLCALC 69 03/26/2024 0759   LDLCALC 85 12/21/2020 1421   Hepatic Function Panel     Component Value Date/Time   PROT 6.8 03/26/2024 0759   ALBUMIN 4.5 03/26/2024 0759   AST 15 03/26/2024 0759   ALT 22 03/26/2024 0759   ALKPHOS 91 03/26/2024 0759   BILITOT 0.4 03/26/2024 0759      Component Value Date/Time   TSH 2.050 05/15/2023 0956   Nutritional Lab Results  Component Value Date   VD25OH 40.6 03/26/2024   VD25OH 107.88 (H) 09/14/2023   VD25OH 29.6 (L)  05/15/2023     Assessment and Plan Assessment & Plan Obesity and DM II Obesity with BMI in the range of 32.0-32.9. She adheres to a category two eating plan 10% of the time, tracks calories, and increases fruit and vegetable intake but struggles with protein intake and hydration. She exercises by walking 30 minutes two to three days a week and has lost one pound in the last two months. Stress and carbohydrate cravings are challenges. Current medication is Mounjaro  at 12.5 mg with no reported side effects. Discussion on protein intake strategies, including the ten to one protein-to-calorie ratio and benefits of food-based protein over supplements, to prevent malnutrition and muscle loss. Type two diabetes is managed with Mounjaro . No reported issues with blood sugar levels or side effects from Mounjaro . - Continue Mounjaro  at 12.5 mg refilled today x 90 days - Encourage adherence to category two eating plan or consider a more flexible plan with increased variety. - Aim for 1300 calories per day to continue weight loss. - Ensure protein intake of at least 85 grams per day, focusing on food sources rather than supplements. - Encourage continued walking exercise. - Provide a cheat sheet of low-calorie, high-protein foods. - Encourage journaling of food intake, including portion, calories, and protein. - Consider repeating metabolism testing if current strategies do not yield desired results.       Daveah was informed of the importance of frequent follow up visits to maximize her success with intensive lifestyle modifications for her obesity and obesity related health conditions as recommended by USPSTF and CMS guidelines   Louann Penton, MD

## 2024-09-12 ENCOUNTER — Other Ambulatory Visit (HOSPITAL_COMMUNITY): Payer: Self-pay

## 2024-09-17 ENCOUNTER — Ambulatory Visit (INDEPENDENT_AMBULATORY_CARE_PROVIDER_SITE_OTHER): Admitting: Family Medicine

## 2024-09-23 ENCOUNTER — Other Ambulatory Visit (HOSPITAL_COMMUNITY): Payer: Self-pay

## 2024-09-25 ENCOUNTER — Other Ambulatory Visit (HOSPITAL_COMMUNITY): Payer: Self-pay

## 2024-09-30 ENCOUNTER — Other Ambulatory Visit: Payer: Self-pay

## 2024-09-30 ENCOUNTER — Other Ambulatory Visit (HOSPITAL_COMMUNITY): Payer: Self-pay

## 2024-09-30 MED ORDER — ROSUVASTATIN CALCIUM 5 MG PO TABS
5.0000 mg | ORAL_TABLET | Freq: Every evening | ORAL | 0 refills | Status: AC
  Filled 2024-09-30: qty 90, 90d supply, fill #0

## 2024-10-01 ENCOUNTER — Ambulatory Visit (INDEPENDENT_AMBULATORY_CARE_PROVIDER_SITE_OTHER): Payer: Self-pay | Admitting: Family Medicine

## 2024-10-01 ENCOUNTER — Encounter (INDEPENDENT_AMBULATORY_CARE_PROVIDER_SITE_OTHER): Payer: Self-pay | Admitting: Family Medicine

## 2024-10-01 ENCOUNTER — Other Ambulatory Visit (HOSPITAL_COMMUNITY): Payer: Self-pay

## 2024-10-01 VITALS — BP 107/69 | HR 74 | Temp 98.1°F | Ht 62.5 in | Wt 183.0 lb

## 2024-10-01 DIAGNOSIS — E669 Obesity, unspecified: Secondary | ICD-10-CM

## 2024-10-01 DIAGNOSIS — Z7985 Long-term (current) use of injectable non-insulin antidiabetic drugs: Secondary | ICD-10-CM

## 2024-10-01 DIAGNOSIS — Z794 Long term (current) use of insulin: Secondary | ICD-10-CM

## 2024-10-01 DIAGNOSIS — Z6832 Body mass index (BMI) 32.0-32.9, adult: Secondary | ICD-10-CM

## 2024-10-01 DIAGNOSIS — E1169 Type 2 diabetes mellitus with other specified complication: Secondary | ICD-10-CM | POA: Diagnosis not present

## 2024-10-01 DIAGNOSIS — E559 Vitamin D deficiency, unspecified: Secondary | ICD-10-CM

## 2024-10-01 MED ORDER — TIRZEPATIDE 12.5 MG/0.5ML ~~LOC~~ SOAJ
12.5000 mg | SUBCUTANEOUS | 0 refills | Status: DC
  Filled 2024-10-01: qty 6, 84d supply, fill #0

## 2024-10-01 NOTE — Progress Notes (Signed)
 Office: [phone]  /  Fax: [phone]  WEIGHT SUMMARY AND BIOMETRICS  Anthropometric Measurements Height: 5' 2.5 (1.588 m) Weight: 183 lb (83 kg) BMI (Calculated): 32.92 Weight at Last Visit: 183 lb Weight Lost Since Last Visit: 0 Weight Gained Since Last Visit: 0 Starting Weight: 201 lb Total Weight Loss (lbs): 18 lb (8.165 kg) Peak Weight: 220 lb   Body Composition  Body Fat %: 41.6 % Fat Mass (lbs): 76.4 lbs Muscle Mass (lbs): 102 lbs Total Body Water  (lbs): 74 lbs Visceral Fat Rating : 11   Other Clinical Data Fasting: no Labs: no Today's Visit #: 16 Starting Date: 05/15/23    Chief Complaint: OBESITY    History of Present Illness Miranda Williams is a 53 year old female with obesity and type 2 diabetes who presents for a follow-up on her obesity treatment plan and diabetes management.  She has been adhering to the category two eating plan approximately fifty percent of the time and engages in physical activity by using a stationary bike for about twenty minutes once or twice a week. Her weight has remained stable over the past six weeks, including through Thanksgiving.  Her type 2 diabetes is managed with Mounjaro  12.5 mg. Her most recent hemoglobin A1c was 5.6, checked in May, and she is due for another lab check. She reports no gastrointestinal issues with Mounjaro .  She experiences fluctuating stress levels at work due to being short-staffed and changes in workload, which sometimes leads to emotional eating. She feels guilty after overeating and is mindful of her protein intake, stating 'most days I'm eating it.'  She anticipates challenges in maintaining her dietary goals with family visiting from [facility] for a week during Christmas.      PHYSICAL EXAM:  Blood pressure 107/69, pulse 74, temperature 98.1 F (36.7 C), height 5' 2.5 (1.588 m), weight 183 lb (83 kg), SpO2 97%. Body mass index is 32.94 kg/m.  DIAGNOSTIC DATA REVIEWED:  BMET     Component Value Date/Time   NA 137 03/26/2024 0759   K 4.7 03/26/2024 0759   CL 103 03/26/2024 0759   CO2 19 (L) 03/26/2024 0759   GLUCOSE 87 03/26/2024 0759   GLUCOSE 102 (H) 09/14/2023 0929   BUN 15 03/26/2024 0759   CREATININE 0.77 03/26/2024 0759   CREATININE 0.59 12/21/2020 1421   CALCIUM  10.2 03/26/2024 0759   GFRNONAA >60 09/14/2023 0929   GFRAA >60 05/30/2019 0818   Lab Results  Component Value Date   HGBA1C 5.6 03/26/2024   HGBA1C 5.9 (H) 05/30/2019   Lab Results  Component Value Date   INSULIN  18.6 03/26/2024   INSULIN  23.7 05/15/2023   Lab Results  Component Value Date   TSH 2.050 05/15/2023   CBC    Component Value Date/Time   WBC 8.3 05/15/2023 0956   WBC 7.7 12/21/2020 1421   RBC 4.71 05/15/2023 0956   RBC 4.31 12/21/2020 1421   HGB 13.7 05/15/2023 0956   HCT 41.0 05/15/2023 0956   PLT 293 05/15/2023 0956   MCV 87 05/15/2023 0956   MCH 29.1 05/15/2023 0956   MCH 29.5 12/21/2020 1421   MCHC 33.4 05/15/2023 0956   MCHC 34.1 12/21/2020 1421   RDW 11.9 05/15/2023 0956   Iron Studies No results found for: IRON, TIBC, FERRITIN, IRONPCTSAT Lipid Panel     Component Value Date/Time   CHOL 136 03/26/2024 0759   TRIG 132 03/26/2024 0759   HDL 44 03/26/2024 0759   CHOLHDL 3.4 07/05/2022  1601   CHOLHDL 3.4 12/21/2020 1421   VLDL 14 05/30/2019 0819   LDLCALC 69 03/26/2024 0759   LDLCALC 85 12/21/2020 1421   Hepatic Function Panel     Component Value Date/Time   PROT 6.8 03/26/2024 0759   ALBUMIN 4.5 03/26/2024 0759   AST 15 03/26/2024 0759   ALT 22 03/26/2024 0759   ALKPHOS 91 03/26/2024 0759   BILITOT 0.4 03/26/2024 0759      Component Value Date/Time   TSH 2.050 05/15/2023 0956   Nutritional Lab Results  Component Value Date   VD25OH 40.6 03/26/2024   VD25OH 107.88 (H) 09/14/2023   VD25OH 29.6 (L) 05/15/2023     Assessment and Plan Assessment & Plan Obesity  Obesity management is ongoing with a focus on lifestyle  modifications. She adheres to the category two eating plan about 50% of the time and exercises 20 minutes on a stationary bike once or twice a week. Weight has been maintained over the last six weeks, including during Thanksgiving. Stress management techniques were discussed to address emotional eating. - Continue category two eating plan - Increase exercise to 150 minutes per week - Implement stress management techniques to address emotional eating  Type 2 diabetes mellitus with other specified complication Type 2 diabetes is managed with Mounjaro  12.5 mg and lifestyle modifications. Recent hemoglobin A1c was 5.6% in May. No gastrointestinal issues reported with Mounjaro . Labs are due for re-evaluation, including blood sugar, kidney and liver function, cholesterol, vitamin D , and thyroid  levels. - Refilled Mounjaro  12.5 mg  Health Maintenance - Ordered fasting labs including blood sugar, kidney and liver function, cholesterol, vitamin D , and thyroid  levels - Advised to have labs drawn at [facility] if convenient - Scheduled follow-up appointments on December 30th and February 2nd      Patients who are on anti-obesity medications are counseled on the importance of maintaining healthy lifestyle habits, including balanced nutrition, regular physical activity, and behavioral modifications,  Medication is an adjunct to, not a replacement for, lifestyle changes and that the long-term success and weight maintenance depend on continued adherence to these strategies.   [name] was informed of the importance of frequent follow up visits to maximize her success with intensive lifestyle modifications for her obesity and obesity related health conditions as recommended by USPSTF and CMS guidelines  Louann Penton, MD

## 2024-10-02 ENCOUNTER — Other Ambulatory Visit (HOSPITAL_COMMUNITY): Payer: Self-pay

## 2024-10-08 DIAGNOSIS — E1169 Type 2 diabetes mellitus with other specified complication: Secondary | ICD-10-CM | POA: Diagnosis not present

## 2024-10-08 DIAGNOSIS — Z794 Long term (current) use of insulin: Secondary | ICD-10-CM | POA: Diagnosis not present

## 2024-10-09 ENCOUNTER — Other Ambulatory Visit (HOSPITAL_COMMUNITY): Payer: Self-pay

## 2024-10-09 LAB — CMP14+EGFR
ALT: 29 IU/L (ref 0–32)
AST: 21 IU/L (ref 0–40)
Albumin: 4.7 g/dL (ref 3.8–4.9)
Alkaline Phosphatase: 95 IU/L (ref 49–135)
BUN/Creatinine Ratio: 25 — ABNORMAL HIGH (ref 9–23)
BUN: 21 mg/dL (ref 6–24)
Bilirubin Total: 0.4 mg/dL (ref 0.0–1.2)
CO2: 21 mmol/L (ref 20–29)
Calcium: 10.1 mg/dL (ref 8.7–10.2)
Chloride: 104 mmol/L (ref 96–106)
Creatinine, Ser: 0.83 mg/dL (ref 0.57–1.00)
Globulin, Total: 2.4 g/dL (ref 1.5–4.5)
Glucose: 96 mg/dL (ref 70–99)
Potassium: 4.9 mmol/L (ref 3.5–5.2)
Sodium: 136 mmol/L (ref 134–144)
Total Protein: 7.1 g/dL (ref 6.0–8.5)
eGFR: 83 mL/min/1.73 (ref >59)

## 2024-10-09 LAB — VITAMIN D 25 HYDROXY (VIT D DEFICIENCY, FRACTURES): Vit D, 25-Hydroxy: 43.1 ng/mL (ref 30.0–100.0)

## 2024-10-09 LAB — LIPID PANEL WITH LDL/HDL RATIO
Cholesterol, Total: 149 mg/dL (ref 100–199)
HDL: 48 mg/dL (ref >39)
LDL Chol Calc (NIH): 79 mg/dL (ref 0–99)
LDL/HDL Ratio: 1.6 ratio (ref 0.0–3.2)
Triglycerides: 124 mg/dL (ref 0–149)
VLDL Cholesterol Cal: 22 mg/dL (ref 5–40)

## 2024-10-09 LAB — INSULIN, RANDOM: INSULIN: 21.1 u[IU]/mL (ref 2.6–24.9)

## 2024-10-09 LAB — VITAMIN B12: Vitamin B-12: 1222 pg/mL (ref 232–1245)

## 2024-10-09 LAB — HEMOGLOBIN A1C
Est. average glucose Bld gHb Est-mCnc: 111 mg/dL
Hgb A1c MFr Bld: 5.5 % (ref 4.8–5.6)

## 2024-10-09 LAB — TSH: TSH: 1.65 u[IU]/mL (ref 0.450–4.500)

## 2024-10-15 ENCOUNTER — Other Ambulatory Visit (HOSPITAL_COMMUNITY): Payer: Self-pay

## 2024-10-17 ENCOUNTER — Other Ambulatory Visit (HOSPITAL_COMMUNITY): Payer: Self-pay

## 2024-10-17 MED ORDER — SPIRONOLACTONE 50 MG PO TABS
50.0000 mg | ORAL_TABLET | Freq: Every day | ORAL | 1 refills | Status: AC
  Filled 2024-10-17: qty 90, 90d supply, fill #0

## 2024-10-18 ENCOUNTER — Other Ambulatory Visit (HOSPITAL_COMMUNITY): Payer: Self-pay

## 2024-10-18 MED ORDER — SPIRONOLACTONE 50 MG PO TABS
50.0000 mg | ORAL_TABLET | Freq: Every day | ORAL | 0 refills | Status: AC
  Filled 2024-10-18: qty 90, 90d supply, fill #0

## 2024-10-29 ENCOUNTER — Encounter (INDEPENDENT_AMBULATORY_CARE_PROVIDER_SITE_OTHER): Payer: Self-pay | Admitting: Family Medicine

## 2024-10-29 ENCOUNTER — Ambulatory Visit (INDEPENDENT_AMBULATORY_CARE_PROVIDER_SITE_OTHER): Payer: Self-pay | Admitting: Family Medicine

## 2024-10-29 ENCOUNTER — Other Ambulatory Visit (HOSPITAL_COMMUNITY): Payer: Self-pay

## 2024-10-29 VITALS — BP 104/67 | HR 72 | Temp 97.8°F | Ht 62.5 in | Wt 183.0 lb

## 2024-10-29 DIAGNOSIS — E1169 Type 2 diabetes mellitus with other specified complication: Secondary | ICD-10-CM

## 2024-10-29 DIAGNOSIS — E119 Type 2 diabetes mellitus without complications: Secondary | ICD-10-CM | POA: Diagnosis not present

## 2024-10-29 DIAGNOSIS — Z7985 Long-term (current) use of injectable non-insulin antidiabetic drugs: Secondary | ICD-10-CM

## 2024-10-29 DIAGNOSIS — E669 Obesity, unspecified: Secondary | ICD-10-CM

## 2024-10-29 DIAGNOSIS — E559 Vitamin D deficiency, unspecified: Secondary | ICD-10-CM

## 2024-10-29 DIAGNOSIS — Z6832 Body mass index (BMI) 32.0-32.9, adult: Secondary | ICD-10-CM

## 2024-10-29 DIAGNOSIS — Z6836 Body mass index (BMI) 36.0-36.9, adult: Secondary | ICD-10-CM

## 2024-10-29 MED ORDER — TIRZEPATIDE 12.5 MG/0.5ML ~~LOC~~ SOAJ
12.5000 mg | SUBCUTANEOUS | 0 refills | Status: AC
  Filled 2024-10-29: qty 6, 84d supply, fill #0

## 2024-10-29 NOTE — Progress Notes (Unsigned)
 "  Office: [phone]  /  Fax: [phone]  WEIGHT SUMMARY AND BIOMETRICS  Anthropometric Measurements Height: 5' 2.5 (1.588 m) Weight: 183 lb (83 kg) BMI (Calculated): 32.92 Weight at Last Visit: 183lb Weight Lost Since Last Visit: 0lb Weight Gained Since Last Visit: 0lb Starting Weight: 201lb Total Weight Loss (lbs): 18 lb (8.165 kg) Peak Weight: 220lb   Body Composition  Body Fat %: 42 % Fat Mass (lbs): 77.2 lbs Muscle Mass (lbs): 101.2 lbs Total Body Water  (lbs): 74.4 lbs Visceral Fat Rating : 11   Other Clinical Data Fasting: No Labs: No Today's Visit #: 17 Starting Date: 05/15/23    Chief Complaint: OBESITY    History of Present Illness Miranda Williams is a 53 year old female with obesity and diabetes who presents for obesity treatment and progress assessment.  She has been following a category two eating plan but has only adhered to it about five percent over the holiday season. Despite this, she has maintained her weight loss, remaining down eighteen pounds from when she first started the program. She walks for exercise two days a week for thirty minutes each time, consumes her recommended amount of protein, and has increased her intake of fruits and vegetables. She is also trying not to skip meals and generally gets seven to nine hours of sleep per night.  In addition to obesity, she is being treated for diabetes. She is currently prescribed Mounjaro  12.5 mg weekly and requests a refill today. Her most recent lab results show an A1c of 5.5; last year, her A1c was 7.3. After taking her Mounjaro  shot on Sundays, she does not feel well on Mondays but feels better by Tuesday. She also experienced a metallic taste once after administering the shot, which she attributes to possibly injecting into a vein or capillary. She has not experienced any additional problems with this.  Her recent lab work included electrolytes, kidney function, liver function, cholesterol  levels, and vitamin D  levels. She is taking over-the-counter vitamin D  and is advised to ensure she is taking around 2000 IU daily. Her B12 levels were within normal limits, and her insulin  levels are in the low twenties. Her thyroid  function tests were within normal limits. No symptoms of shortness of breath were reported.      PHYSICAL EXAM:  Blood pressure 104/67, pulse 72, temperature 97.8 F (36.6 C), height 5' 2.5 (1.588 m), weight 183 lb (83 kg), SpO2 98%. Body mass index is 32.94 kg/m.  DIAGNOSTIC DATA REVIEWED:  BMET    Component Value Date/Time   NA 136 10/08/2024 0807   K 4.9 10/08/2024 0807   CL 104 10/08/2024 0807   CO2 21 10/08/2024 0807   GLUCOSE 96 10/08/2024 0807   GLUCOSE 102 (H) 09/14/2023 0929   BUN 21 10/08/2024 0807   CREATININE 0.83 10/08/2024 0807   CREATININE 0.59 12/21/2020 1421   CALCIUM  10.1 10/08/2024 0807   GFRNONAA >60 09/14/2023 0929   GFRAA >60 05/30/2019 0818   Lab Results  Component Value Date   HGBA1C 5.5 10/08/2024   HGBA1C 5.9 (H) 05/30/2019   Lab Results  Component Value Date   INSULIN  21.1 10/08/2024   INSULIN  23.7 05/15/2023   Lab Results  Component Value Date   TSH 1.650 10/08/2024   CBC    Component Value Date/Time   WBC 8.3 05/15/2023 0956   WBC 7.7 12/21/2020 1421   RBC 4.71 05/15/2023 0956   RBC 4.31 12/21/2020 1421   HGB 13.7 05/15/2023 0956  HCT 41.0 05/15/2023 0956   PLT 293 05/15/2023 0956   MCV 87 05/15/2023 0956   MCH 29.1 05/15/2023 0956   MCH 29.5 12/21/2020 1421   MCHC 33.4 05/15/2023 0956   MCHC 34.1 12/21/2020 1421   RDW 11.9 05/15/2023 0956   Iron Studies No results found for: IRON, TIBC, FERRITIN, IRONPCTSAT Lipid Panel     Component Value Date/Time   CHOL 149 10/08/2024 0807   TRIG 124 10/08/2024 0807   HDL 48 10/08/2024 0807   CHOLHDL 3.4 07/05/2022 1601   CHOLHDL 3.4 12/21/2020 1421   VLDL 14 05/30/2019 0819   LDLCALC 79 10/08/2024 0807   LDLCALC 85 12/21/2020 1421    Hepatic Function Panel     Component Value Date/Time   PROT 7.1 10/08/2024 0807   ALBUMIN 4.7 10/08/2024 0807   AST 21 10/08/2024 0807   ALT 29 10/08/2024 0807   ALKPHOS 95 10/08/2024 0807   BILITOT 0.4 10/08/2024 0807      Component Value Date/Time   TSH 1.650 10/08/2024 0807   Nutritional Lab Results  Component Value Date   VD25OH 43.1 10/08/2024   VD25OH 40.6 03/26/2024   VD25OH 107.88 (H) 09/14/2023     Assessment and Plan Assessment & Plan Obesity Management is ongoing with a focus on maintaining weight loss. She has maintained her weight loss over the holidays, which is excellent. She is following a category two eating plan, though adherence was limited during the holidays. She is engaging in physical activity by walking two days a week for 30 minutes each session. She is consuming recommended amounts of protein and increasing fruits and vegetables in her diet. She is also ensuring adequate sleep, averaging 7-9 hours per night. - Continue category two eating plan - Continue walking for exercise two days a week for 30 minutes - Maintain current dietary habits focusing on protein, fruits, and vegetables - Ensure adequate sleep of 7-9 hours per night  Type 2 diabetes mellitus Well-controlled with an A1c of 5.5, significantly improved from 7.3 last year. She is currently on Mounjaro  12.5 mg weekly. She reports feeling unwell on Mondays, likely due to the medication's release pattern. No significant side effects reported, except for a metallic taste once, possibly due to injection into a capillary. She is advised to adjust the injection day to a weekend to manage side effects better. - Continue Mounjaro  12.5 mg weekly - Adjust injection day to Saturday to manage side effects - Sent 90-day refill of Mounjaro  to [facility]  Vitamin D  deficiency Vitamin D  levels have decreased to 43, close to the target range. She is currently taking an over-the-counter vitamin D  supplement,  but the exact dosage is unknown. - Recommended taking 2000 IU of vitamin D  daily - Instructed to verify current vitamin D  supplement dosage      Patients who are on anti-obesity medications are counseled on the importance of maintaining healthy lifestyle habits, including balanced nutrition, regular physical activity, and behavioral modifications,  Medication is an adjunct to, not a replacement for, lifestyle changes and that the long-term success and weight maintenance depend on continued adherence to these strategies.   Areebah was informed of the importance of frequent follow up visits to maximize her success with intensive lifestyle modifications for her obesity and obesity related health conditions as recommended by USPSTF and CMS guidelines   Louann Penton, MD   "

## 2024-10-30 ENCOUNTER — Other Ambulatory Visit: Payer: Self-pay

## 2024-12-02 ENCOUNTER — Ambulatory Visit (INDEPENDENT_AMBULATORY_CARE_PROVIDER_SITE_OTHER): Payer: Self-pay | Admitting: Family Medicine

## 2024-12-17 ENCOUNTER — Ambulatory Visit: Admitting: Adult Health

## 2024-12-30 ENCOUNTER — Ambulatory Visit (INDEPENDENT_AMBULATORY_CARE_PROVIDER_SITE_OTHER): Admitting: Family Medicine
